# Patient Record
Sex: Male | Born: 1970 | Race: Black or African American | Hispanic: No | Marital: Married | Smoking: Current some day smoker
Health system: Southern US, Community
[De-identification: ages and names within clinical notes are randomized; demographics above are authoritative.]

## PROBLEM LIST (undated history)

## (undated) DIAGNOSIS — I10 Essential (primary) hypertension: Secondary | ICD-10-CM

## (undated) DIAGNOSIS — K219 Gastro-esophageal reflux disease without esophagitis: Secondary | ICD-10-CM

## (undated) DIAGNOSIS — M542 Cervicalgia: Secondary | ICD-10-CM

## (undated) DIAGNOSIS — G8929 Other chronic pain: Secondary | ICD-10-CM

## (undated) DIAGNOSIS — M549 Dorsalgia, unspecified: Secondary | ICD-10-CM

## (undated) DIAGNOSIS — N2881 Hypertrophy of kidney: Secondary | ICD-10-CM

## (undated) DIAGNOSIS — G4730 Sleep apnea, unspecified: Secondary | ICD-10-CM

## (undated) DIAGNOSIS — J449 Chronic obstructive pulmonary disease, unspecified: Secondary | ICD-10-CM

## (undated) DIAGNOSIS — F419 Anxiety disorder, unspecified: Secondary | ICD-10-CM

## (undated) DIAGNOSIS — G43909 Migraine, unspecified, not intractable, without status migrainosus: Secondary | ICD-10-CM

## (undated) DIAGNOSIS — F329 Major depressive disorder, single episode, unspecified: Secondary | ICD-10-CM

## (undated) DIAGNOSIS — E119 Type 2 diabetes mellitus without complications: Secondary | ICD-10-CM

## (undated) DIAGNOSIS — I517 Cardiomegaly: Secondary | ICD-10-CM

## (undated) DIAGNOSIS — F32A Depression, unspecified: Secondary | ICD-10-CM

## (undated) HISTORY — DX: Cardiomegaly: I51.7

## (undated) HISTORY — DX: Depression, unspecified: F32.A

## (undated) HISTORY — DX: Gastro-esophageal reflux disease without esophagitis: K21.9

## (undated) HISTORY — PX: CIRCUMCISION: SUR203

## (undated) HISTORY — DX: Anxiety disorder, unspecified: F41.9

## (undated) HISTORY — DX: Major depressive disorder, single episode, unspecified: F32.9

## (undated) HISTORY — DX: Migraine, unspecified, not intractable, without status migrainosus: G43.909

---

## 2002-09-26 ENCOUNTER — Emergency Department (HOSPITAL_COMMUNITY): Admission: EM | Admit: 2002-09-26 | Discharge: 2002-09-26 | Payer: Self-pay | Admitting: Emergency Medicine

## 2003-02-26 ENCOUNTER — Emergency Department (HOSPITAL_COMMUNITY): Admission: EM | Admit: 2003-02-26 | Discharge: 2003-02-26 | Payer: Self-pay | Admitting: Emergency Medicine

## 2003-07-24 ENCOUNTER — Emergency Department (HOSPITAL_COMMUNITY): Admission: EM | Admit: 2003-07-24 | Discharge: 2003-07-24 | Payer: Self-pay | Admitting: Emergency Medicine

## 2004-04-08 ENCOUNTER — Emergency Department: Payer: Self-pay | Admitting: Emergency Medicine

## 2007-05-24 ENCOUNTER — Ambulatory Visit: Payer: Self-pay | Admitting: Family Medicine

## 2007-09-13 ENCOUNTER — Emergency Department: Payer: Self-pay | Admitting: Emergency Medicine

## 2008-09-10 ENCOUNTER — Ambulatory Visit: Payer: Self-pay | Admitting: Neurosurgery

## 2009-04-03 ENCOUNTER — Emergency Department: Payer: Self-pay | Admitting: Emergency Medicine

## 2009-05-08 ENCOUNTER — Emergency Department: Payer: Self-pay | Admitting: Emergency Medicine

## 2009-12-08 ENCOUNTER — Observation Stay: Payer: Self-pay | Admitting: Internal Medicine

## 2010-08-01 ENCOUNTER — Emergency Department: Payer: Self-pay | Admitting: Internal Medicine

## 2010-10-09 ENCOUNTER — Ambulatory Visit: Payer: Self-pay | Admitting: Family Medicine

## 2010-11-30 ENCOUNTER — Ambulatory Visit: Payer: Self-pay

## 2011-04-09 ENCOUNTER — Emergency Department: Payer: Self-pay | Admitting: Unknown Physician Specialty

## 2011-04-09 LAB — CBC WITH DIFFERENTIAL/PLATELET
Basophil #: 0 10*3/uL (ref 0.0–0.1)
Eosinophil #: 0 10*3/uL (ref 0.0–0.7)
Eosinophil %: 0.2 %
HCT: 41.6 % (ref 40.0–52.0)
Lymphocyte #: 1.9 10*3/uL (ref 1.0–3.6)
MCH: 30.5 pg (ref 26.0–34.0)
MCHC: 32.8 g/dL (ref 32.0–36.0)
MCV: 93 fL (ref 80–100)
Monocyte #: 1 10*3/uL — ABNORMAL HIGH (ref 0.0–0.7)
Monocyte %: 8 %
Neutrophil %: 76.1 %
Platelet: 220 10*3/uL (ref 150–440)
RBC: 4.48 10*6/uL (ref 4.40–5.90)
RDW: 15 % — ABNORMAL HIGH (ref 11.5–14.5)
WBC: 12.5 10*3/uL — ABNORMAL HIGH (ref 3.8–10.6)

## 2011-04-09 LAB — COMPREHENSIVE METABOLIC PANEL
Albumin: 3.7 g/dL (ref 3.4–5.0)
Alkaline Phosphatase: 127 U/L (ref 50–136)
Anion Gap: 12 (ref 7–16)
BUN: 8 mg/dL (ref 7–18)
Bilirubin,Total: 0.7 mg/dL (ref 0.2–1.0)
Calcium, Total: 9.2 mg/dL (ref 8.5–10.1)
Chloride: 104 mmol/L (ref 98–107)
Creatinine: 0.92 mg/dL (ref 0.60–1.30)
EGFR (African American): >60
EGFR (Non-African Amer.): >60
Glucose: 97 mg/dL (ref 65–99)
Potassium: 4.2 mmol/L (ref 3.5–5.1)
SGOT(AST): 24 U/L (ref 15–37)
SGPT (ALT): 33 U/L
Sodium: 137 mmol/L (ref 136–145)
Total Protein: 8.4 g/dL — ABNORMAL HIGH (ref 6.4–8.2)

## 2011-04-09 LAB — MONONUCLEOSIS SCREEN: Mono Test: NEGATIVE

## 2011-04-15 LAB — CULTURE, BLOOD (SINGLE)

## 2011-06-07 ENCOUNTER — Emergency Department: Payer: Self-pay | Admitting: Emergency Medicine

## 2011-06-07 LAB — URINALYSIS, COMPLETE
Bacteria: NONE SEEN
Bilirubin,UR: NEGATIVE
Glucose,UR: NEGATIVE mg/dL (ref 0–75)
Ketone: NEGATIVE
Leukocyte Esterase: NEGATIVE
Protein: NEGATIVE
RBC,UR: 2 /HPF (ref 0–5)
WBC UR: NONE SEEN /HPF (ref 0–5)

## 2011-06-07 LAB — COMPREHENSIVE METABOLIC PANEL
Alkaline Phosphatase: 123 U/L (ref 50–136)
BUN: 16 mg/dL (ref 7–18)
Calcium, Total: 9.1 mg/dL (ref 8.5–10.1)
Co2: 27 mmol/L (ref 21–32)
EGFR (African American): >60
EGFR (Non-African Amer.): >60
Glucose: 102 mg/dL — ABNORMAL HIGH (ref 65–99)
Osmolality: 285 (ref 275–301)
Potassium: 3.5 mmol/L (ref 3.5–5.1)
SGOT(AST): 24 U/L (ref 15–37)
SGPT (ALT): 29 U/L
Sodium: 142 mmol/L (ref 136–145)

## 2011-06-07 LAB — CBC
HGB: 13.3 g/dL (ref 13.0–18.0)
MCHC: 32.6 g/dL (ref 32.0–36.0)
MCV: 94 fL (ref 80–100)
Platelet: 256 10*3/uL (ref 150–440)
RBC: 4.35 10*6/uL — ABNORMAL LOW (ref 4.40–5.90)
RDW: 15.7 % — ABNORMAL HIGH (ref 11.5–14.5)
WBC: 6.9 10*3/uL (ref 3.8–10.6)

## 2012-10-02 LAB — COMPREHENSIVE METABOLIC PANEL
Albumin: 3.7 g/dL (ref 3.4–5.0)
Alkaline Phosphatase: 156 U/L — ABNORMAL HIGH (ref 50–136)
Anion Gap: 7 (ref 7–16)
BUN: 11 mg/dL (ref 7–18)
Bilirubin,Total: 0.4 mg/dL (ref 0.2–1.0)
Calcium, Total: 9.1 mg/dL (ref 8.5–10.1)
Chloride: 107 mmol/L (ref 98–107)
Co2: 25 mmol/L (ref 21–32)
Creatinine: 1.12 mg/dL (ref 0.60–1.30)
EGFR (African American): >60
EGFR (Non-African Amer.): >60
Glucose: 109 mg/dL — ABNORMAL HIGH (ref 65–99)
Osmolality: 278 (ref 275–301)
SGOT(AST): 19 U/L (ref 15–37)
SGPT (ALT): 29 U/L (ref 12–78)
Sodium: 139 mmol/L (ref 136–145)
Total Protein: 7.6 g/dL (ref 6.4–8.2)

## 2012-10-02 LAB — URINALYSIS, COMPLETE
Bacteria: NONE SEEN
Bilirubin,UR: NEGATIVE
Glucose,UR: NEGATIVE mg/dL (ref 0–75)
Ketone: NEGATIVE
Leukocyte Esterase: NEGATIVE
Nitrite: NEGATIVE
Ph: 6 (ref 4.5–8.0)
Protein: NEGATIVE
RBC,UR: 9 /HPF (ref 0–5)
Specific Gravity: 1.021 (ref 1.003–1.030)
Squamous Epithelial: 1

## 2012-10-02 LAB — DRUG SCREEN, URINE
Amphetamines, Ur Screen: NEGATIVE (ref ?–1000)
Barbiturates, Ur Screen: NEGATIVE (ref ?–200)
Benzodiazepine, Ur Scrn: NEGATIVE (ref ?–200)
Cannabinoid 50 Ng, Ur ~~LOC~~: NEGATIVE (ref ?–50)
Cocaine Metabolite,Ur ~~LOC~~: NEGATIVE (ref ?–300)
Methadone, Ur Screen: NEGATIVE (ref ?–300)
Opiate, Ur Screen: NEGATIVE (ref ?–300)
Tricyclic, Ur Screen: NEGATIVE (ref ?–1000)

## 2012-10-02 LAB — CBC
HCT: 41.5 % (ref 40.0–52.0)
HGB: 14 g/dL (ref 13.0–18.0)
MCHC: 33.8 g/dL (ref 32.0–36.0)
MCV: 93 fL (ref 80–100)
Platelet: 238 10*3/uL (ref 150–440)
RBC: 4.47 10*6/uL (ref 4.40–5.90)
WBC: 9.1 10*3/uL (ref 3.8–10.6)

## 2012-10-02 LAB — ETHANOL: Ethanol %: <0.003 % (ref 0.000–0.080)

## 2012-10-03 ENCOUNTER — Inpatient Hospital Stay: Payer: Self-pay | Admitting: Psychiatry

## 2013-05-15 ENCOUNTER — Emergency Department: Payer: Self-pay | Admitting: Emergency Medicine

## 2013-05-15 LAB — ETHANOL
Ethanol %: 0.023 % (ref 0.000–0.080)
Ethanol: 23 mg/dL

## 2013-05-15 LAB — CBC
HCT: 40.1 % (ref 40.0–52.0)
HGB: 13.2 g/dL (ref 13.0–18.0)
MCH: 31.1 pg (ref 26.0–34.0)
MCHC: 33 g/dL (ref 32.0–36.0)
MCV: 94 fL (ref 80–100)
Platelet: 172 10*3/uL (ref 150–440)
RBC: 4.24 10*6/uL — ABNORMAL LOW (ref 4.40–5.90)
RDW: 14.1 % (ref 11.5–14.5)
WBC: 11.1 10*3/uL — ABNORMAL HIGH (ref 3.8–10.6)

## 2013-05-15 LAB — COMPREHENSIVE METABOLIC PANEL
Albumin: 3.7 g/dL (ref 3.4–5.0)
Alkaline Phosphatase: 133 U/L — ABNORMAL HIGH
Anion Gap: 7 (ref 7–16)
BUN: 13 mg/dL (ref 7–18)
Bilirubin,Total: 0.3 mg/dL (ref 0.2–1.0)
Calcium, Total: 8.6 mg/dL (ref 8.5–10.1)
Chloride: 105 mmol/L (ref 98–107)
Co2: 23 mmol/L (ref 21–32)
Creatinine: 1.1 mg/dL (ref 0.60–1.30)
EGFR (African American): >60
EGFR (Non-African Amer.): >60
Glucose: 133 mg/dL — ABNORMAL HIGH (ref 65–99)
Osmolality: 272 (ref 275–301)
Potassium: 3.7 mmol/L (ref 3.5–5.1)
SGOT(AST): 49 U/L — ABNORMAL HIGH (ref 15–37)
SGPT (ALT): 60 U/L (ref 12–78)
Sodium: 135 mmol/L — ABNORMAL LOW (ref 136–145)
Total Protein: 7.9 g/dL (ref 6.4–8.2)

## 2013-05-15 LAB — DRUG SCREEN, URINE

## 2013-05-23 ENCOUNTER — Emergency Department: Payer: Self-pay | Admitting: Emergency Medicine

## 2013-07-11 ENCOUNTER — Emergency Department: Payer: Self-pay | Admitting: Emergency Medicine

## 2013-07-11 LAB — CBC
HCT: 41.9 % (ref 40.0–52.0)
HGB: 13.6 g/dL (ref 13.0–18.0)
MCH: 30.6 pg (ref 26.0–34.0)
MCHC: 32.4 g/dL (ref 32.0–36.0)
MCV: 94 fL (ref 80–100)
Platelet: 268 10*3/uL (ref 150–440)
RBC: 4.44 10*6/uL (ref 4.40–5.90)
RDW: 14.8 % — ABNORMAL HIGH (ref 11.5–14.5)
WBC: 12.8 10*3/uL — ABNORMAL HIGH (ref 3.8–10.6)

## 2013-07-11 LAB — URINALYSIS, COMPLETE
Bacteria: NONE SEEN
Bilirubin,UR: NEGATIVE
Glucose,UR: NEGATIVE mg/dL (ref 0–75)
Ketone: NEGATIVE
Leukocyte Esterase: NEGATIVE
Nitrite: NEGATIVE
Ph: 5 (ref 4.5–8.0)
Protein: 30
RBC,UR: 11 /HPF (ref 0–5)
Specific Gravity: 1.027 (ref 1.003–1.030)
Squamous Epithelial: 1
WBC UR: 1 /HPF (ref 0–5)

## 2013-07-11 LAB — COMPREHENSIVE METABOLIC PANEL
Albumin: 3.6 g/dL (ref 3.4–5.0)
Alkaline Phosphatase: 187 U/L — ABNORMAL HIGH
Anion Gap: 7 (ref 7–16)
BUN: 10 mg/dL (ref 7–18)
Bilirubin,Total: 0.7 mg/dL (ref 0.2–1.0)
Calcium, Total: 9.8 mg/dL (ref 8.5–10.1)
Chloride: 103 mmol/L (ref 98–107)
Co2: 27 mmol/L (ref 21–32)
Creatinine: 0.9 mg/dL (ref 0.60–1.30)
EGFR (African American): >60
EGFR (Non-African Amer.): >60
Glucose: 102 mg/dL — ABNORMAL HIGH (ref 65–99)
Osmolality: 273 (ref 275–301)
Potassium: 3.9 mmol/L (ref 3.5–5.1)
SGOT(AST): 20 U/L (ref 15–37)
SGPT (ALT): 33 U/L (ref 12–78)
Sodium: 137 mmol/L (ref 136–145)
Total Protein: 8 g/dL (ref 6.4–8.2)

## 2013-07-11 LAB — LIPASE, BLOOD: Lipase: 277 U/L (ref 73–393)
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
## 2014-05-30 NOTE — Consult Note (Signed)
Brief Consult Note: Diagnosis: major depression.   Patient was seen by consultant.   Consult note dictated.   Recommend further assessment or treatment.   Orders entered.   Comments: Psychiatry: Patient seen. Under IVC for severe depression and SI. Needs inpt hospital tx. Orders done to admit to [facility].  Electronic Signatures: Gonzella Lex (MD)  (Signed 27-Aug-14 14:03)  Authored: Brief Consult Note   Last Updated: 27-Aug-14 14:03 by Gonzella Lex (MD)

## 2015-08-26 ENCOUNTER — Encounter: Payer: Self-pay | Admitting: Emergency Medicine

## 2015-08-26 ENCOUNTER — Emergency Department: Payer: Medicare Other

## 2015-08-26 ENCOUNTER — Emergency Department
Admission: EM | Admit: 2015-08-26 | Discharge: 2015-08-26 | Disposition: A | Discharge status: Home / Self Care | Payer: Medicare Other | Attending: Student | Admitting: Student

## 2015-08-26 DIAGNOSIS — E119 Type 2 diabetes mellitus without complications: Secondary | ICD-10-CM | POA: Insufficient documentation

## 2015-08-26 DIAGNOSIS — G43909 Migraine, unspecified, not intractable, without status migrainosus: Secondary | ICD-10-CM | POA: Diagnosis not present

## 2015-08-26 DIAGNOSIS — R4182 Altered mental status, unspecified: Secondary | ICD-10-CM | POA: Diagnosis present

## 2015-08-26 DIAGNOSIS — I10 Essential (primary) hypertension: Secondary | ICD-10-CM | POA: Diagnosis not present

## 2015-08-26 DIAGNOSIS — F419 Anxiety disorder, unspecified: Secondary | ICD-10-CM | POA: Diagnosis not present

## 2015-08-26 DIAGNOSIS — F17200 Nicotine dependence, unspecified, uncomplicated: Secondary | ICD-10-CM | POA: Insufficient documentation

## 2015-08-26 HISTORY — DX: Hypertrophy of kidney: N28.81

## 2015-08-26 HISTORY — DX: Cervicalgia: M54.2

## 2015-08-26 HISTORY — DX: Type 2 diabetes mellitus without complications: E11.9

## 2015-08-26 HISTORY — DX: Dorsalgia, unspecified: M54.9

## 2015-08-26 HISTORY — DX: Other chronic pain: G89.29

## 2015-08-26 HISTORY — DX: Essential (primary) hypertension: I10

## 2015-08-26 LAB — URINALYSIS COMPLETE WITH MICROSCOPIC (ARMC ONLY)
BILIRUBIN URINE: NEGATIVE
GLUCOSE, UA: NEGATIVE mg/dL
KETONES UR: NEGATIVE mg/dL
Leukocytes, UA: NEGATIVE
Nitrite: NEGATIVE
PROTEIN: 30 mg/dL — AB
SPECIFIC GRAVITY, URINE: 1.016 (ref 1.005–1.030)
pH: 6 (ref 5.0–8.0)

## 2015-08-26 LAB — CBC
HCT: 42.4 % (ref 40.0–52.0)
Hemoglobin: 14 g/dL (ref 13.0–18.0)
MCH: 30.4 pg (ref 26.0–34.0)
MCHC: 33 g/dL (ref 32.0–36.0)
MCV: 92 fL (ref 80.0–100.0)
PLATELETS: 254 10*3/uL (ref 150–440)
RBC: 4.6 MIL/uL (ref 4.40–5.90)
RDW: 14.8 % — AB (ref 11.5–14.5)
WBC: 8.8 10*3/uL (ref 3.8–10.6)

## 2015-08-26 LAB — BASIC METABOLIC PANEL
Anion gap: 9 (ref 5–15)
BUN: 18 mg/dL (ref 6–20)
CALCIUM: 9.4 mg/dL (ref 8.9–10.3)
CO2: 23 mmol/L (ref 22–32)
CREATININE: 1.07 mg/dL (ref 0.61–1.24)
Chloride: 106 mmol/L (ref 101–111)
GFR calc non Af Amer: >60 mL/min (ref >60)
Glucose, Bld: 167 mg/dL — ABNORMAL HIGH (ref 65–99)
Potassium: 3.6 mmol/L (ref 3.5–5.1)
SODIUM: 138 mmol/L (ref 135–145)

## 2015-08-26 LAB — ACETAMINOPHEN LEVEL: Acetaminophen (Tylenol), Serum: <10 ug/mL — ABNORMAL LOW (ref 10–30)

## 2015-08-26 LAB — TROPONIN I

## 2015-08-26 LAB — ETHANOL: Alcohol, Ethyl (B): <5 mg/dL (ref <5)

## 2015-08-26 LAB — SALICYLATE LEVEL: Salicylate Lvl: <4 mg/dL (ref 2.8–30.0)

## 2015-08-26 MED ORDER — KETOROLAC TROMETHAMINE 30 MG/ML IJ SOLN
15.0000 mg | Freq: Once | INTRAMUSCULAR | Status: AC
  Administered 2015-08-26: 15 mg via INTRAVENOUS
  Filled 2015-08-26: qty 1

## 2015-08-26 NOTE — ED Provider Notes (Signed)
[facility] Emergency Department Provider Note   ____________________________________________  Time seen: Approximately 6:48 PM  I have reviewed the triage vital signs and the nursing notes.   HISTORY  Chief Complaint Headache   HPI Martis Dommer is a 45 y.o. male with history of migraine headaches, chronic back pain, diabetes, hypertension who presents for evaluation of a migraine headache today as well as anxiety, gradual onset, initially moderate, now rates headache as 1 out of 10, improved with Aleve. Patient reports that today he developed a headache consistent with his usual migraine headache around 11 AM. Headache was initially moderate and then became more severe. He tried to treat with Aleve but reports that he did not feel his headache was getting better quickly and that made him "uneasy". He began having a lot of anxiety over this. On EMS arrival his blood pressure was elevated and he was brought to the emergency permit for evaluation. Currently he reports he feels much better. He denies any recent illness including no vomiting, diarrhea, fevers or chills. He denies any chest pain or difficulty breathing. He denies any fevers.   Past Medical History  Diagnosis Date  . Hypertension   . Diabetes mellitus without complication ([place])   . Chronic back pain   . Chronic neck pain   . Enlarged kidney Left    There are no active problems to display for this patient.   History reviewed. No pertinent past surgical history.  No current outpatient prescriptions on file.  Allergies Review of patient's allergies indicates no known allergies.  No family history on file.  Social History Social History  Substance Use Topics  . Smoking status: Current Every Day Smoker  . Smokeless tobacco: None  . Alcohol Use: Yes     Comment: rare    Review of Systems Constitutional: No fever/chills Eyes: No visual changes. ENT: No sore throat. Cardiovascular:  Denies chest pain. Respiratory: Denies shortness of breath. Gastrointestinal: No abdominal pain.  No nausea, no vomiting.  No diarrhea.  No constipation. Genitourinary: Negative for dysuria. Musculoskeletal: Negative for back pain. Skin: Negative for rash. Neurological: Positive for headaches, no focal weakness or numbness.  10-point ROS otherwise negative.  ____________________________________________   PHYSICAL EXAM:  VITAL SIGNS: ED Triage Vitals  Enc Vitals Group     BP 08/26/15 1638 140/100 mmHg     Pulse Rate 08/26/15 1638 76     Resp 08/26/15 1638 20     Temp 08/26/15 1638 98 F (36.7 C)     Temp Source 08/26/15 1638 Oral     SpO2 08/26/15 1638 98 %     Weight 08/26/15 1638 170 lb (77.111 kg)     Height 08/26/15 1638 5\' 8"  (1.727 m)     Head Cir --      Peak Flow --      Pain Score 08/26/15 1639 5     Pain Loc --      Pain Edu? --      Excl. in [place]? --     Constitutional: Alert and oriented. Well appearing and in no acute distress. Eyes: Conjunctivae are normal. PERRL. EOMI. Head: Atraumatic. Nose: No congestion/rhinnorhea. Mouth/Throat: Mucous membranes are moist.  Oropharynx non-erythematous. Neck: No stridor. Supple without meningismus.  Cardiovascular: Normal rate, regular rhythm. Grossly normal heart sounds.  Good peripheral circulation. Respiratory: Normal respiratory effort.  No retractions. Lungs CTAB. Gastrointestinal: Soft and nontender. No distention.  No CVA tenderness. Genitourinary: deferred Musculoskeletal: No lower extremity tenderness nor  edema.  No joint effusions. Neurologic:  Normal speech and language. No gross focal neurologic deficits are appreciated. No gait instability. 5 out of 5 strength in bilateral upper and lower extremities, sensation intact to light touch throughout, cranial nerves II through XII intact. Skin:  Skin is warm, dry and intact. No rash noted. Psychiatric: Mood and affect are normal. Speech and behavior are  normal.  ____________________________________________   LABS (all labs ordered are listed, but only abnormal results are displayed)  Labs Reviewed  BASIC METABOLIC PANEL - Abnormal; Notable for the following:    Glucose, Bld 167 (*)    All other components within normal limits  CBC - Abnormal; Notable for the following:    RDW 14.8 (*)    All other components within normal limits  URINALYSIS COMPLETEWITH MICROSCOPIC ([facility] ONLY) - Abnormal; Notable for the following:    Color, Urine YELLOW (*)    APPearance CLEAR (*)    Hgb urine dipstick 1+ (*)    Protein, ur 30 (*)    Bacteria, UA RARE (*)    Squamous Epithelial / LPF 0-5 (*)    All other components within normal limits  ACETAMINOPHEN LEVEL - Abnormal; Notable for the following:    Acetaminophen (Tylenol), Serum <10 (*)    All other components within normal limits  ETHANOL  SALICYLATE LEVEL  TROPONIN I   ____________________________________________  EKG  ED ECG REPORT I, Joanne Gavel, the attending physician, personally viewed and interpreted this ECG.   Date: 08/26/2015  EKG Time: 16:46  Rate: 75  Rhythm: normal sinus rhythm  Axis: normal  Intervals:none  ST&T Change: No acute ST elevation or acute ST depression. Q waves with T wave inversions in the inferior leads, this was seen in lead II on the prior EKG from 2013. It is also noted in V6.  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 Study otherwise unremarkable. In particular, no intracranial mass, hemorrhage, or focal gray - white compartment lesion.  ____________________________________________   PROCEDURES  Procedure(s) performed: None  Procedures  Critical Care performed: No  ____________________________________________   INITIAL IMPRESSION / ASSESSMENT AND PLAN / ED COURSE  Pertinent labs & imaging results that were available during my care of the  patient were reviewed by me and considered in my medical decision making (see chart for details).  Emanuell Claussen is a 45 y.o. male with history of migraine headaches, chronic back pain, diabetes, hypertension who presents for evaluation of a migraine headache today as well as anxiety, gradual onset, initially moderate, now rates headache as 1 out of 10, improved with Aleve. On exam, he is very well-appearing and in no acute distress, vital signs are stable, he is afebrile. Neck supple without meningismus, intact neurological exam, not consistent with subarachnoid hemorrhage or meningitis. I reviewed his labs. CBC and BMP unremarkable. Undetectable ethanol significant and salicylate levels. Urinalysis is not consistent with infection. 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 Suspect migraine headache which is now significantly improved as well as anxiety/panic attack. He is not altered in any way though this was noted in the triage note. Awaiting troponin. He received Toradol and reports continued improvement of his headache. If his troponin is unremarkable, anticipate he can be discharged with close outpatient follow-up.  ----------------------------------------- 8:21 PM on 08/26/2015 -----------------------------------------  Troponin negative. Patient feels well. We discussed return precautions, need for close PCP follow-up and he  is, trouble with the discharge plan. DC home. ____________________________________________   FINAL CLINICAL IMPRESSION(S) / ED DIAGNOSES  Final diagnoses:  Migraine without status migrainosus, not intractable, unspecified migraine type  Anxiety      NEW MEDICATIONS STARTED DURING THIS VISIT:  New Prescriptions   No medications on file     Note:  This document was prepared using Dragon voice recognition software and may include unintentional dictation errors.    Joanne Gavel, MD 08/26/15 2021

## 2015-08-26 NOTE — Discharge Instructions (Signed)
Migraine Headache A migraine headache is very bad, throbbing pain on one or both sides of your head. Talk to your doctor about what things may bring on (trigger) your migraine headaches. HOME CARE  Only take medicines as told by your doctor.  Lie down in a dark, quiet room when you have a migraine.  Keep a journal to find out if certain things bring on migraine headaches. For example, write down:  What you eat and drink.  How much sleep you get.  Any change to your diet or medicines.  Lessen how much alcohol you drink.  Quit smoking if you smoke.  Get enough sleep.  Lessen any stress in your life.  Keep lights dim if bright lights bother you or make your migraines worse. GET HELP RIGHT AWAY IF:   Your migraine becomes really bad.  You have a fever.  You have a stiff neck.  You have trouble seeing.  Your muscles are weak, or you lose muscle control.  You lose your balance or have trouble walking.  You feel like you will pass out (faint), or you pass out.  You have really bad symptoms that are different than your first symptoms. MAKE SURE YOU:   Understand these instructions.  Will watch your condition.  Will get help right away if you are not doing well or get worse.   This information is not intended to replace advice given to you by your health care provider. Make sure you discuss any questions you have with your health care provider.   Document Released: 11/03/2007 Document Revised: 04/18/2011 Document Reviewed: 10/01/2012 Elsevier Interactive Patient Education 2016 Elsevier Inc.  Panic Attacks Panic attacks are sudden, short-livedsurges of severe anxiety, fear, or discomfort. They may occur for no reason when you are relaxed, when you are anxious, or when you are sleeping. Panic attacks may occur for a number of reasons:   Healthy people occasionally have panic attacks in extreme, life-threatening situations, such as war or natural disasters. Normal  anxiety is a protective mechanism of the body that helps [place] react to danger (fight or flight response).  Panic attacks are often seen with anxiety disorders, such as panic disorder, social anxiety disorder, generalized anxiety disorder, and phobias. Anxiety disorders cause excessive or uncontrollable anxiety. They may interfere with your relationships or other life activities.  Panic attacks are sometimes seen with other mental illnesses, such as depression and posttraumatic stress disorder.  Certain medical conditions, prescription medicines, and drugs of abuse can cause panic attacks. SYMPTOMS  Panic attacks start suddenly, peak within 20 minutes, and are accompanied by four or more of the following symptoms:  Pounding heart or fast heart rate (palpitations).  Sweating.  Trembling or shaking.  Shortness of breath or feeling smothered.  Feeling choked.  Chest pain or discomfort.  Nausea or strange feeling in your stomach.  Dizziness, light-headedness, or feeling like you will faint.  Chills or hot flushes.  Numbness or tingling in your lips or hands and feet.  Feeling that things are not real or feeling that you are not yourself.  Fear of losing control or going crazy.  Fear of dying. Some of these symptoms can mimic serious medical conditions. For example, you may think you are having a heart attack. Although panic attacks can be very scary, they are not life threatening. DIAGNOSIS  Panic attacks are diagnosed through an assessment by your health care provider. Your health care provider will ask questions about your symptoms, such as where and  when they occurred. Your health care provider will also ask about your medical history and use of alcohol and drugs, including prescription medicines. Your health care provider may order blood tests or other studies to rule out a serious medical condition. Your health care provider may refer you to a mental health professional for further  evaluation. TREATMENT   Most healthy people who have one or two panic attacks in an extreme, life-threatening situation will not require treatment.  The treatment for panic attacks associated with anxiety disorders or other mental illness typically involves counseling with a mental health professional, medicine, or a combination of both. Your health care provider will help determine what treatment is best for you.  Panic attacks due to physical illness usually go away with treatment of the illness. If prescription medicine is causing panic attacks, talk with your health care provider about stopping the medicine, decreasing the dose, or substituting another medicine.  Panic attacks due to alcohol or drug abuse go away with abstinence. Some adults need professional help in order to stop drinking or using drugs. HOME CARE INSTRUCTIONS   Take all medicines as directed by your health care provider.   Schedule and attend follow-up visits as directed by your health care provider. It is important to keep all your appointments. SEEK MEDICAL CARE IF:  You are not able to take your medicines as prescribed.  Your symptoms do not improve or get worse. SEEK IMMEDIATE MEDICAL CARE IF:   You experience panic attack symptoms that are different than your usual symptoms.  You have serious thoughts about hurting yourself or others.  You are taking medicine for panic attacks and have a serious side effect. MAKE SURE YOU:  Understand these instructions.  Will watch your condition.  Will get help right away if you are not doing well or get worse.   This information is not intended to replace advice given to you by your health care provider. Make sure you discuss any questions you have with your health care provider.   Document Released: 01/24/2005 Document Revised: 01/29/2013 Document Reviewed: 09/07/2012 Elsevier Interactive Patient Education [employer].

## 2015-08-26 NOTE — ED Notes (Signed)
[facility], [facility], and weakness. Patient took mother to appointment at approx noon, was not feeling well since approx 1030 (general malaise, HA). States he went home afterwards. Patient arrives to ER with AMS, weakness, and still small headache. Wife states patient had h/o two other episodes similar to this in last 5 years with diagnosis being HTN and anxiety.

## 2015-12-01 DIAGNOSIS — G479 Sleep disorder, unspecified: Secondary | ICD-10-CM | POA: Insufficient documentation

## 2015-12-01 DIAGNOSIS — G43009 Migraine without aura, not intractable, without status migrainosus: Secondary | ICD-10-CM | POA: Insufficient documentation

## 2015-12-01 DIAGNOSIS — F411 Generalized anxiety disorder: Secondary | ICD-10-CM | POA: Insufficient documentation

## 2016-01-12 DIAGNOSIS — M545 Low back pain, unspecified: Secondary | ICD-10-CM | POA: Insufficient documentation

## 2016-01-12 DIAGNOSIS — R200 Anesthesia of skin: Secondary | ICD-10-CM | POA: Insufficient documentation

## 2016-01-12 DIAGNOSIS — G8929 Other chronic pain: Secondary | ICD-10-CM | POA: Insufficient documentation

## 2016-01-12 DIAGNOSIS — R202 Paresthesia of skin: Secondary | ICD-10-CM

## 2016-03-08 ENCOUNTER — Ambulatory Visit: Payer: Medicare Other | Attending: Physical Medicine and Rehabilitation

## 2016-03-08 VITALS — BP 145/99 | HR 85

## 2016-03-08 DIAGNOSIS — R262 Difficulty in walking, not elsewhere classified: Secondary | ICD-10-CM | POA: Diagnosis present

## 2016-03-08 DIAGNOSIS — M5416 Radiculopathy, lumbar region: Secondary | ICD-10-CM

## 2016-03-08 DIAGNOSIS — M5412 Radiculopathy, cervical region: Secondary | ICD-10-CM | POA: Insufficient documentation

## 2016-03-08 DIAGNOSIS — M545 Low back pain: Secondary | ICD-10-CM | POA: Insufficient documentation

## 2016-03-08 DIAGNOSIS — G8929 Other chronic pain: Secondary | ICD-10-CM

## 2016-03-08 DIAGNOSIS — M542 Cervicalgia: Secondary | ICD-10-CM

## 2016-03-08 DIAGNOSIS — M6281 Muscle weakness (generalized): Secondary | ICD-10-CM | POA: Insufficient documentation
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
## 2016-03-08 NOTE — Patient Instructions (Signed)
  Pt was recommended to use a lumbar towel roll to be placed at the most comfortable area of his back (and comfortable thickness) for 2 minutes at a time daily for back pain when sitting. Pt verbalized understanding.

## 2016-03-10 ENCOUNTER — Ambulatory Visit: Payer: Medicare Other | Attending: Physical Medicine and Rehabilitation

## 2016-03-10 DIAGNOSIS — M542 Cervicalgia: Secondary | ICD-10-CM | POA: Insufficient documentation

## 2016-03-10 DIAGNOSIS — G8929 Other chronic pain: Secondary | ICD-10-CM | POA: Insufficient documentation

## 2016-03-10 DIAGNOSIS — M6281 Muscle weakness (generalized): Secondary | ICD-10-CM | POA: Insufficient documentation

## 2016-03-10 DIAGNOSIS — M545 Low back pain: Secondary | ICD-10-CM | POA: Insufficient documentation

## 2016-03-10 DIAGNOSIS — R262 Difficulty in walking, not elsewhere classified: Secondary | ICD-10-CM | POA: Insufficient documentation

## 2016-03-10 DIAGNOSIS — M5412 Radiculopathy, cervical region: Secondary | ICD-10-CM | POA: Insufficient documentation

## 2016-03-10 DIAGNOSIS — M5416 Radiculopathy, lumbar region: Secondary | ICD-10-CM | POA: Insufficient documentation

## 2016-03-15 ENCOUNTER — Ambulatory Visit: Payer: Medicare Other

## 2016-03-15 DIAGNOSIS — M542 Cervicalgia: Secondary | ICD-10-CM

## 2016-03-15 DIAGNOSIS — M6281 Muscle weakness (generalized): Secondary | ICD-10-CM | POA: Diagnosis present

## 2016-03-15 DIAGNOSIS — M5416 Radiculopathy, lumbar region: Secondary | ICD-10-CM | POA: Diagnosis present

## 2016-03-15 DIAGNOSIS — M5412 Radiculopathy, cervical region: Secondary | ICD-10-CM

## 2016-03-15 DIAGNOSIS — G8929 Other chronic pain: Secondary | ICD-10-CM

## 2016-03-15 DIAGNOSIS — R262 Difficulty in walking, not elsewhere classified: Secondary | ICD-10-CM

## 2016-03-15 DIAGNOSIS — M545 Low back pain: Secondary | ICD-10-CM | POA: Diagnosis not present
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
## 2016-03-15 NOTE — Patient Instructions (Addendum)
  Pt was also recommended pt to perform gentle pain free chin tucks throughout the day as well as to maintain the upright back posture comfortably throughout the day. Pt demonstrated and verbalized understanding.     Reviewed over the counter TENs unit option for pain control at home. Pt demonstrated and verbalized understanding.

## 2016-03-17 ENCOUNTER — Ambulatory Visit: Payer: Medicare Other

## 2016-03-17 DIAGNOSIS — M6281 Muscle weakness (generalized): Secondary | ICD-10-CM

## 2016-03-17 DIAGNOSIS — M545 Low back pain: Secondary | ICD-10-CM | POA: Diagnosis not present

## 2016-03-17 DIAGNOSIS — R262 Difficulty in walking, not elsewhere classified: Secondary | ICD-10-CM

## 2016-03-17 DIAGNOSIS — M5416 Radiculopathy, lumbar region: Secondary | ICD-10-CM

## 2016-03-17 DIAGNOSIS — G8929 Other chronic pain: Secondary | ICD-10-CM

## 2016-03-17 NOTE — Patient Instructions (Addendum)
     Sitting on an upright chair with a towel roll at your low back:   Gently press your right heel onto the floor for 2 seconds.    Gently relax.    Repeat 5 times for 3 sets daily.    Perform for your left heel as well.
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
## 2016-03-21 ENCOUNTER — Ambulatory Visit: Payer: Medicare Other

## 2016-03-21 DIAGNOSIS — G5603 Carpal tunnel syndrome, bilateral upper limbs: Secondary | ICD-10-CM | POA: Insufficient documentation

## 2016-03-22 ENCOUNTER — Ambulatory Visit: Payer: Medicare Other

## 2016-03-22 DIAGNOSIS — G8929 Other chronic pain: Secondary | ICD-10-CM

## 2016-03-22 DIAGNOSIS — M6281 Muscle weakness (generalized): Secondary | ICD-10-CM

## 2016-03-22 DIAGNOSIS — M545 Low back pain: Principal | ICD-10-CM

## 2016-03-22 DIAGNOSIS — R262 Difficulty in walking, not elsewhere classified: Secondary | ICD-10-CM
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
## 2016-03-24 ENCOUNTER — Ambulatory Visit: Payer: Medicare Other

## 2016-03-29 ENCOUNTER — Telehealth: Payer: Self-pay

## 2016-03-29 ENCOUNTER — Ambulatory Visit: Payer: Medicare Other

## 2016-03-29 DIAGNOSIS — K921 Melena: Secondary | ICD-10-CM | POA: Insufficient documentation

## 2016-03-29 NOTE — Telephone Encounter (Signed)
   No show. Unable to contact pt secondary to mobile phone having a busy signal, and unable to leave a message for his home phone line.

## 2016-03-31 ENCOUNTER — Ambulatory Visit: Payer: Medicare Other

## 2016-03-31 DIAGNOSIS — M545 Low back pain: Secondary | ICD-10-CM | POA: Diagnosis not present

## 2016-03-31 DIAGNOSIS — G8929 Other chronic pain: Secondary | ICD-10-CM

## 2016-03-31 DIAGNOSIS — M6281 Muscle weakness (generalized): Secondary | ICD-10-CM

## 2016-03-31 DIAGNOSIS — R262 Difficulty in walking, not elsewhere classified: Secondary | ICD-10-CM

## 2016-03-31 DIAGNOSIS — M5416 Radiculopathy, lumbar region: Secondary | ICD-10-CM
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
## 2016-04-05 ENCOUNTER — Ambulatory Visit: Payer: Medicare Other | Admitting: Physical Therapy

## 2016-04-05 VITALS — BP 140/90

## 2016-04-05 DIAGNOSIS — G8929 Other chronic pain: Secondary | ICD-10-CM

## 2016-04-05 DIAGNOSIS — M6281 Muscle weakness (generalized): Secondary | ICD-10-CM

## 2016-04-05 DIAGNOSIS — M542 Cervicalgia: Secondary | ICD-10-CM

## 2016-04-05 DIAGNOSIS — R262 Difficulty in walking, not elsewhere classified: Secondary | ICD-10-CM

## 2016-04-05 DIAGNOSIS — M545 Low back pain: Principal | ICD-10-CM

## 2016-04-05 DIAGNOSIS — M5416 Radiculopathy, lumbar region: Secondary | ICD-10-CM

## 2016-04-05 DIAGNOSIS — M5412 Radiculopathy, cervical region: Secondary | ICD-10-CM

## 2016-04-06 ENCOUNTER — Other Ambulatory Visit: Payer: Self-pay | Admitting: Neurology

## 2016-04-06 ENCOUNTER — Telehealth: Payer: Self-pay | Admitting: Physical Therapy

## 2016-04-06 DIAGNOSIS — G43019 Migraine without aura, intractable, without status migrainosus: Secondary | ICD-10-CM

## 2016-04-06 NOTE — Telephone Encounter (Signed)
Aquatic PT phoned the office of Dr. Theotis Burrow, MD [phone] (pt's PCP). Message was recorded with staff re: pt's elevated BP 160/100 mm Hg across two PT sessions. Yesterday's visit, pre-Tx 140/90 mm Hg and post Tx 160/100 mm Hg and did not go done after resting for 30 min. PT wanted MD to be made aware of his BP elevation

## 2016-04-06 NOTE — Telephone Encounter (Signed)
Aquatic PT phoned the office of Dr. Saralyn Pilar office. Message was taken by staff re: pt's elevated BP 160/100 across two visits (1 land visit prior to pool Tx and after pool Tx). Pt's future sessions will be held on land for safety. PT inquired re: a psychiatrist referral for pt as he would benefit greatly with more support for his anxiety and depression.

## 2016-04-06 NOTE — Patient Instructions (Signed)
Body scan technique (emailed)  Paced breathing 1-2- pause, 2-1 pause  Graded movement (moving before the point of pain)

## 2016-04-06 NOTE — Therapy (Addendum)
[place] [facility] [address] [place], [place], [address] Phone: [phone]   Fax:  [phone]  Physical Therapy Treatment  Patient Details  Name: [name] MRN: [id number] Date of Birth: Jan 02, 1971 Referring Provider: Sharlet Salina, DO  Encounter Date: 04/05/2016      PT End of Session - 04/06/16 0953    Visit Number 6   Number of Visits 17   Date for PT Re-Evaluation 05/05/16   Authorization Type 6   Authorization Time Period of 10 g code   PT Start Time 0845   PT Stop Time 1015   PT Time Calculation (min) 90 min   Activity Tolerance Patient limited by pain   Behavior During Therapy [facility] for tasks assessed/performed      Past Medical History:  Diagnosis Date  . Anxiety   . Chronic back pain   . Chronic neck pain   . Depression   . Diabetes mellitus without complication ([place])   . Enlarged heart    pt report  . Enlarged kidney Left  . GERD (gastroesophageal reflux disease)   . Headache, migraine   . Hypertension     No past surgical history on file.  Vitals:   04/05/16 0906  BP: 140/90        Subjective Assessment - 04/06/16 2145    Subjective Pt states that Dr. Melrose Nakayama increased the dosage of one of his medications, pt does not remember which medication.  Back is an 8/10. Still hurts. Has been having migrains, that's why he has not been coming.  Pt states that he knows how to get to the aqua therapy.                      Adult Aquatic Therapy - 04/06/16 0947      Aquatic Therapy Subjective   Subjective Pt reported feeling pain with standing for long periods. Pt has been sleeping 2-3 hours due to pain . Pt has had migraines, is sensitive to noise, and has had pani attacks. Pt did not eat breakfast this morning and usually skips it           O: Pt entered the pool via steps with single UE support on rail (pain reported) and exited pool via ramp.   50 ft =1 lap  Exercises  performed in 3'6" depth   Stretches with education on graded exposure  Rotation, flexion/extension, side flexion of neck  Tolerated ~5 deg rotation, ~20 deg side flexion, mini squat with education to use this technique for bending instead of forward flexion    Radiating pain down B arms to deltoid level, decreased with median nerve glide 5 reps  Breathing education and practice Pace breathing inhale 1-2- pause, exhale 2-1 pause  Transition to supine to floating on noodles under hips, pelvis, back, armpit, neck , support and drawn by PT  Education on body scan, pain science  2 laps  Graded exposure to BUE/LE abduction, adduction ~10 deg  Report of shoulder stiffness/pain after 2 laps    Guided and assisted pt on transitioning to stand with minimal strain on neck and back.                PT Education - 04/06/16 [phone]    Education provided Yes   Education Details paced breathing, graded movement, eating breakfast for PT and monitoring BP and blood sugars, f/u with MD re: elevated BP, motivational interviewing with encouragement towards improvement of health  and pain   Person(s) Educated Patient   Methods Explanation;Demonstration;Tactile cues;Verbal cues   Comprehension Returned demonstration;Verbalized understanding             PT Long Term Goals - 03/08/16 1310      PT LONG TERM GOAL #1   Title Patient will improve his Modified Oswestry Low Back Pain Disability Questionnaire score by at least 12% as a demonstration of improved function.    Baseline 70% (03/08/2016)   Time 8   Period Weeks   Status New     PT LONG TERM GOAL #2   Title Patient will improve his Quick Dash Disability/Symptom Score by at least 15% as a demonstration of improved function.    Baseline 79.55% (03/08/2016)   Time 8   Period Weeks   Status New     PT LONG TERM GOAL #3   Title Patient will have a decrease in back pain to 5/10 or less at worst to promote ability to perform functional  tasks.    Baseline 8/10 back pain at worst (03/08/2016)   Time 8   Period Weeks   Status New     PT LONG TERM GOAL #4   Title Patient will have a decrease in neck pain to 5/10 or less at worst to promote ability to perform functional tasks, turn his head.    Baseline 8/10 neck pain at worst (03/08/2016)   Time 8   Period Weeks   Status New               Plan - 04/06/16 [id number]    Clinical Impression Statement Pt reported that the pool session was relaxing.Pt demo'd less guarded posture end of session.  Pt tolerated small ranges of movement and demo'd understanding to stop before pain ad learned paced breathing and customized movements to participate further in the session. Pt showed elevated BP following 30 min pool Tx. Pre-Tx: 140/90 mm/Hg , Post Tx: 160/100 mm Hg,  after 30 min rest break with guided slow breathing: 160/100 mm Hg.  Pt tolerated the pool session which was primarily focused on pain science education, graded movement in supine (~20 deg BUE/LE abduction/adduction with coordinated slowed breathing), floating on noodles with support of PT. Pt reported feeling relaxed but started to feel stiffnesss in his L shoulder after 2 laps (50 ft x 2). Pt was assisted to standing position gradually and pt reported LBP pain and heaviness in his leg with standing. Pt was guided to exit the pool at which he reported dizziness and was provided SBA via steps using single UE on rail.  Pt reported dizziness upon exiting the pool via steps with single UE on rail with PT providing standby assist. Pt was placed in a chair and and BP was monitored. Pt showed no sweating, SOB, nor  facial droop, slurred speech. Pt remained coherent and was provided Gatorade and crackers.  PT has notified his PCP and referring provider. Pt was educated that future sessions will be conducted on land. PT called patient 2 hours later and he made it home safe and feeling better. PT left messages with PCP and referring provider re:  high BP.  Pt remains motivated to improve pain to be able to do more and to be a father for his children but expressed he is faced with depression and anxiety.  Pt has not seen his psychiatrist recently.  PT informed referring provider that pt would benefit from a referral for psychotherapist/psychiatrist and will have a beter  prognosis with an interdisciplinary healthcare team. Pt will continue to benefit from skilled PT and the use of a biopsychosocial approach to pain management, customized manual Tx/ therapeutic ex to decrease radiating pain, and body mechanics training.    Rehab Potential Fair   Clinical Impairments Affecting Rehab Potential chronicity of condition, pain   PT Frequency 2x / week   PT Duration 8 weeks   PT Treatment/Interventions Aquatic Therapy;Manual techniques;Dry needling;Patient/family education;Neuromuscular re-education;Therapeutic exercise;Therapeutic activities;Cryotherapy;Ultrasound;Traction;Moist Heat;Iontophoresis 4mg /ml Dexamethasone;Electrical Stimulation;Gait training;Functional mobility training;Balance training   PT Next Visit Plan Modalities PRN for pain control, gentle strengthening, function, aquatic therapy (when available)   Consulted and Agree with Plan of Care Patient      Patient will benefit from skilled therapeutic intervention in order to improve the following deficits and impairments:  Pain, Postural dysfunction, Improper body mechanics, Difficulty walking, Decreased strength, Decreased range of motion, Abnormal gait  Visit Diagnosis: Chronic bilateral low back pain, with sciatica presence unspecified  Difficulty in walking, not elsewhere classified  Muscle weakness (generalized)  Radiculopathy, lumbar region  Cervicalgia  Radiculopathy, cervical region     Problem List There are no active problems to display for this patient.   Jerl Mina ,PT, DPT, E-RYT  04/06/2016, 9:45 PM  [place]  MAIN [facility] SERVICES [address] [place], [place], [address] Phone: [phone]   Fax:  [phone]  Name: [name] MRN: [id number] Date of Birth: November 14, 1970

## 2016-04-08 ENCOUNTER — Emergency Department: Payer: Medicare Other

## 2016-04-08 ENCOUNTER — Encounter: Payer: Self-pay | Admitting: Emergency Medicine

## 2016-04-08 ENCOUNTER — Observation Stay
Admission: EM | Admit: 2016-04-08 | Discharge: 2016-04-09 | Disposition: A | Discharge status: Home / Self Care | Payer: Medicare Other | Attending: Internal Medicine | Admitting: Internal Medicine

## 2016-04-08 DIAGNOSIS — F17200 Nicotine dependence, unspecified, uncomplicated: Secondary | ICD-10-CM | POA: Diagnosis not present

## 2016-04-08 DIAGNOSIS — K219 Gastro-esophageal reflux disease without esophagitis: Secondary | ICD-10-CM | POA: Insufficient documentation

## 2016-04-08 DIAGNOSIS — E1140 Type 2 diabetes mellitus with diabetic neuropathy, unspecified: Secondary | ICD-10-CM | POA: Diagnosis not present

## 2016-04-08 DIAGNOSIS — R079 Chest pain, unspecified: Secondary | ICD-10-CM | POA: Insufficient documentation

## 2016-04-08 DIAGNOSIS — G43909 Migraine, unspecified, not intractable, without status migrainosus: Secondary | ICD-10-CM | POA: Diagnosis not present

## 2016-04-08 DIAGNOSIS — Z9114 Patient's other noncompliance with medication regimen: Secondary | ICD-10-CM | POA: Insufficient documentation

## 2016-04-08 DIAGNOSIS — I10 Essential (primary) hypertension: Secondary | ICD-10-CM | POA: Diagnosis not present

## 2016-04-08 DIAGNOSIS — F419 Anxiety disorder, unspecified: Secondary | ICD-10-CM | POA: Diagnosis not present

## 2016-04-08 DIAGNOSIS — I160 Hypertensive urgency: Principal | ICD-10-CM | POA: Insufficient documentation

## 2016-04-08 DIAGNOSIS — F329 Major depressive disorder, single episode, unspecified: Secondary | ICD-10-CM | POA: Insufficient documentation

## 2016-04-08 DIAGNOSIS — Z7984 Long term (current) use of oral hypoglycemic drugs: Secondary | ICD-10-CM | POA: Diagnosis not present

## 2016-04-08 DIAGNOSIS — G8929 Other chronic pain: Secondary | ICD-10-CM | POA: Diagnosis not present

## 2016-04-08 DIAGNOSIS — Z79899 Other long term (current) drug therapy: Secondary | ICD-10-CM | POA: Diagnosis not present

## 2016-04-08 LAB — TROPONIN I
Troponin I: <0.03 ng/mL (ref <0.03)
Troponin I: <0.03 ng/mL (ref <0.03)

## 2016-04-08 LAB — BASIC METABOLIC PANEL
ANION GAP: 9 (ref 5–15)
BUN: 14 mg/dL (ref 6–20)
CO2: 27 mmol/L (ref 22–32)
Calcium: 9.9 mg/dL (ref 8.9–10.3)
Chloride: 105 mmol/L (ref 101–111)
Creatinine, Ser: 1.06 mg/dL (ref 0.61–1.24)
GFR calc Af Amer: >60 mL/min (ref >60)
Glucose, Bld: 110 mg/dL — ABNORMAL HIGH (ref 65–99)
POTASSIUM: 3.8 mmol/L (ref 3.5–5.1)
SODIUM: 141 mmol/L (ref 135–145)

## 2016-04-08 LAB — GLUCOSE, CAPILLARY
Glucose-Capillary: 113 mg/dL — ABNORMAL HIGH (ref 65–99)
Glucose-Capillary: 117 mg/dL — ABNORMAL HIGH (ref 65–99)

## 2016-04-08 LAB — CBC
HEMATOCRIT: 44.1 % (ref 40.0–52.0)
HEMOGLOBIN: 14.2 g/dL (ref 13.0–18.0)
MCH: 28.9 pg (ref 26.0–34.0)
MCHC: 32.2 g/dL (ref 32.0–36.0)
MCV: 89.7 fL (ref 80.0–100.0)
Platelets: 338 10*3/uL (ref 150–440)
RBC: 4.92 MIL/uL (ref 4.40–5.90)
RDW: 14.9 % — ABNORMAL HIGH (ref 11.5–14.5)
WBC: 8.4 10*3/uL (ref 3.8–10.6)

## 2016-04-08 MED ORDER — METOPROLOL TARTRATE 25 MG PO TABS
25.0000 mg | ORAL_TABLET | Freq: Two times a day (BID) | ORAL | Status: DC
  Administered 2016-04-08 – 2016-04-09 (×3): 25 mg via ORAL
  Filled 2016-04-08 (×3): qty 1

## 2016-04-08 MED ORDER — SUMATRIPTAN SUCCINATE 50 MG PO TABS
100.0000 mg | ORAL_TABLET | ORAL | Status: DC | PRN
  Administered 2016-04-09: 100 mg via ORAL
  Filled 2016-04-08 (×3): qty 2

## 2016-04-08 MED ORDER — METFORMIN HCL 500 MG PO TABS
500.0000 mg | ORAL_TABLET | Freq: Two times a day (BID) | ORAL | Status: DC
  Administered 2016-04-08 – 2016-04-09 (×2): 500 mg via ORAL
  Filled 2016-04-08 (×2): qty 1

## 2016-04-08 MED ORDER — HYDRALAZINE HCL 20 MG/ML IJ SOLN
5.0000 mg | Freq: Once | INTRAMUSCULAR | Status: AC
  Administered 2016-04-08: 5 mg via INTRAVENOUS
  Filled 2016-04-08: qty 1

## 2016-04-08 MED ORDER — LISINOPRIL 10 MG PO TABS
10.0000 mg | ORAL_TABLET | Freq: Once | ORAL | Status: AC
  Administered 2016-04-08: 10 mg via ORAL
  Filled 2016-04-08: qty 1

## 2016-04-08 MED ORDER — ASPIRIN 81 MG PO CHEW
324.0000 mg | CHEWABLE_TABLET | Freq: Once | ORAL | Status: AC
  Administered 2016-04-08: 324 mg via ORAL
  Filled 2016-04-08: qty 4

## 2016-04-08 MED ORDER — BUTALBITAL-APAP-CAFFEINE 50-325-40 MG PO TABS
ORAL_TABLET | Freq: Every day | ORAL | Status: DC
  Administered 2016-04-08 – 2016-04-09 (×2): 1 via ORAL
  Filled 2016-04-08 (×2): qty 1

## 2016-04-08 MED ORDER — MORPHINE SULFATE (PF) 2 MG/ML IV SOLN
2.0000 mg | Freq: Once | INTRAVENOUS | Status: AC
  Administered 2016-04-08: 2 mg via INTRAVENOUS
  Filled 2016-04-08: qty 1

## 2016-04-08 MED ORDER — METOPROLOL TARTRATE 5 MG/5ML IV SOLN: 5.0000 mg | INTRAVENOUS | Status: DC | PRN

## 2016-04-08 MED ORDER — SERTRALINE HCL 50 MG PO TABS
50.0000 mg | ORAL_TABLET | Freq: Every day | ORAL | Status: DC
  Administered 2016-04-09: 50 mg via ORAL
  Filled 2016-04-08: qty 1

## 2016-04-08 MED ORDER — ACETAMINOPHEN 325 MG PO TABS
650.0000 mg | ORAL_TABLET | ORAL | Status: DC | PRN
  Administered 2016-04-08: 650 mg via ORAL
  Filled 2016-04-08: qty 2

## 2016-04-08 MED ORDER — LISINOPRIL 20 MG PO TABS
40.0000 mg | ORAL_TABLET | Freq: Every day | ORAL | Status: DC
  Administered 2016-04-08 – 2016-04-09 (×2): 40 mg via ORAL
  Filled 2016-04-08 (×2): qty 2

## 2016-04-08 MED ORDER — ENOXAPARIN SODIUM 40 MG/0.4ML ~~LOC~~ SOLN
40.0000 mg | SUBCUTANEOUS | Status: DC
  Administered 2016-04-08: 40 mg via SUBCUTANEOUS
  Filled 2016-04-08: qty 0.4

## 2016-04-08 MED ORDER — PANTOPRAZOLE SODIUM 40 MG PO TBEC
40.0000 mg | DELAYED_RELEASE_TABLET | Freq: Every day | ORAL | Status: DC
  Administered 2016-04-09: 40 mg via ORAL
  Filled 2016-04-08: qty 1

## 2016-04-08 MED ORDER — MORPHINE SULFATE (PF) 4 MG/ML IV SOLN
4.0000 mg | Freq: Once | INTRAVENOUS | Status: AC
  Administered 2016-04-08: 4 mg via INTRAVENOUS
  Filled 2016-04-08: qty 1

## 2016-04-08 MED ORDER — GABAPENTIN 300 MG PO CAPS
300.0000 mg | ORAL_CAPSULE | Freq: Every day | ORAL | Status: DC
  Administered 2016-04-08: 300 mg via ORAL
  Filled 2016-04-08: qty 1

## 2016-04-08 MED ORDER — ONDANSETRON HCL 4 MG/2ML IJ SOLN
4.0000 mg | Freq: Once | INTRAMUSCULAR | Status: AC
  Administered 2016-04-08: 4 mg via INTRAVENOUS
  Filled 2016-04-08: qty 2

## 2016-04-08 MED ORDER — ONDANSETRON HCL 4 MG/2ML IJ SOLN: 4.0000 mg | Freq: Four times a day (QID) | INTRAMUSCULAR | Status: DC | PRN

## 2016-04-08 NOTE — ED Provider Notes (Addendum)
[facility] Emergency Department Provider Note   ____________________________________________   First MD Initiated Contact with Patient 04/08/16 1145     (approximate)  I have reviewed the triage vital signs and the nursing notes.   HISTORY  Chief Complaint Chest Pain    HPI [name] is a 46 y.o. male reports a previous history of high blood pressure, migraine headaches.  Today while at rest, patient was at home he began noticing a throbbing headache in the front that started at about 10 AM today. The headache steadily progressed, and he also began experiencing tightening feeling across his chest. He reports he had a moderate chest discomfort without shortness of breath but with associated nausea and feeling sweaty. The symptoms have resolved, but he is continued to have an increasing and 10 out of 10 severe frontal throbbing headache. He reports it feels like his blood pressure is high. He denies any visual changes. He had a tingling sensation in his fingers, of both hands but this has improved.  Presently reports no chest pain or trouble breathing. No ripping tearing or moving pain. Reports of the present time his only pain is 10 out of 10 severe bifrontal headache which she describes a "migraine". He does report similar headaches in the past, and has a history of "migraines".   Past Medical History:  Diagnosis Date  . Anxiety   . Chronic back pain   . Chronic neck pain   . Depression   . Diabetes mellitus without complication ([place])   . Enlarged heart    pt report  . Enlarged kidney Left  . GERD (gastroesophageal reflux disease)   . Headache, migraine   . Hypertension     There are no active problems to display for this patient.   History reviewed. No pertinent surgical history.  Prior to Admission medications   Medication Sig Start Date End Date Taking? Authorizing Provider  Butalbital-APAP-Caffeine (FIORICET PO) Take 1 tablet by mouth  daily.    Yes Historical Provider, MD  gabapentin (NEURONTIN) 300 MG capsule Take 300 mg by mouth at bedtime. 02/29/16  Yes Historical Provider, MD  lisinopril (PRINIVIL,ZESTRIL) 40 MG tablet Take 40 mg by mouth daily.   Yes Historical Provider, MD  metFORMIN (GLUCOPHAGE) 500 MG tablet Take by mouth 2 (two) times daily with a meal.   Yes Historical Provider, MD  metoprolol tartrate (LOPRESSOR) 25 MG tablet Take 25 mg by mouth 2 (two) times daily.   Yes Historical Provider, MD  Pantoprazole Sodium (PROTONIX PO) Take by mouth.   Yes Historical Provider, MD  sertraline (ZOLOFT) 50 MG tablet Take 50 mg by mouth daily.   Yes Historical Provider, MD  SUMAtriptan (IMITREX) 100 MG tablet Take 100 mg by mouth. May repeat in 2 hours if headache persists or recurs.    Historical Provider, MD    Allergies Patient has no known allergies.  History reviewed. No pertinent family history.  Social History Social History  Substance Use Topics  . Smoking status: Current Every Day Smoker  . Smokeless tobacco: Not on file  . Alcohol use Yes     Comment: rare    Review of Systems Constitutional: No fever/chills Eyes: No visual changes. ENT: No sore throat.No neck pain. Cardiovascular: See history of present illness Respiratory: Denies shortness of breath. Gastrointestinal: No abdominal pain.  No vomiting.  No diarrhea.  No constipation. Genitourinary: Negative for dysuria. Musculoskeletal: Negative for back pain. Skin: Negative for rash. Neurological: Negative for  focal  weakness or trouble speaking. No trouble with gait.  10-point ROS otherwise negative.  ____________________________________________   PHYSICAL EXAM:  VITAL SIGNS: ED Triage Vitals  Enc Vitals Group     BP 04/08/16 1140 (!) 182/111     Pulse Rate 04/08/16 1139 76     Resp 04/08/16 1139 (!) 28     Temp 04/08/16 1140 98.3 F (36.8 C)     Temp Source 04/08/16 1140 Oral     SpO2 04/08/16 1139 100 %     Weight 04/08/16 1137  185 lb (83.9 kg)     Height 04/08/16 1137 5\' 8"  (1.727 m)     Head Circumference --      Peak Flow --      Pain Score 04/08/16 1137 8     Pain Loc --      Pain Edu? --      Excl. in [place]? --     Constitutional: Alert and oriented. Appears in severe pain, wincing, has warm wet blanket over his face reporting a severe headache. Denies chest pain or shortness of breath Eyes: Conjunctivae are normal. PERRL. EOMI. notable photophobia. Ophthalmoscope exam demonstrates no retinal hemorrhage or vitreous hemorrhage. The posterior retina is clear bilateral.  Head: Atraumatic. Nose: No congestion/rhinnorhea. Mouth/Throat: Mucous membranes are moist.  Oropharynx non-erythematous. Neck: No stridor.  No meningismus. Full range of motion of neck without pain. Cardiovascular: Normal rate, regular rhythm. Grossly normal heart sounds.  Good peripheral circulation. Respiratory: Normal respiratory effort.  No retractions. Lungs CTAB. Gastrointestinal: Soft and nontender. No distention.  Musculoskeletal: No lower extremity tenderness nor edema.  No joint effusions. Neurologic:  Normal speech and language. No gross focal neurologic deficits are appreciated. No gait instability. Skin:  Skin is warm, dry and intact. No rash noted. Psychiatric: Mood and affect are normal. Speech and behavior are normal.  ____________________________________________   LABS (all labs ordered are listed, but only abnormal results are displayed)  Labs Reviewed  BASIC METABOLIC PANEL - Abnormal; Notable for the following:       Result Value   Glucose, Bld 110 (*)    All other components within normal limits  CBC - Abnormal; Notable for the following:    RDW 14.9 (*)    All other components within normal limits  TROPONIN I   ____________________________________________  EKG  Reviewed and interpreted by me at 11:46 AM Heart rate 70 Care is 90 QTc 4:30 Normal sinus rhythm, consistent with left ventricular hypertrophy  with mild ST changes noted in V2 and V3, most consistent with early repolarization abnormality ____________________________________________  RADIOLOGY  Ct Head Wo Contrast  Result Date: 04/08/2016 CLINICAL DATA:  Headache, nausea EXAM: CT HEAD WITHOUT CONTRAST TECHNIQUE: Contiguous axial images were obtained from the base of the skull through the vertex without intravenous contrast. COMPARISON:  08/26/2015 FINDINGS: Brain: No intracranial hemorrhage, mass effect or midline shift. No acute cortical infarction. No mass lesion is noted on this unenhanced scan. The gray and white-matter differentiation is preserved. Vascular: No hyperdense vessel or unexpected calcification. Skull: Normal. Negative for fracture or focal lesion. Sinuses/Orbits: No acute finding. Again noted chronic partial opacification of left mastoid air cells without change from prior exam. Other: None IMPRESSION: No acute intracranial abnormality. No definite acute cortical infarction. No significant change. Electronically Signed   By: Lahoma Crocker M.D.   On: 04/08/2016 12:15   Dg Chest Portable 1 View  Result Date: 04/08/2016 CLINICAL DATA:  Chest pain, nausea EXAM: PORTABLE CHEST 1 VIEW COMPARISON:  05/23/2013 FINDINGS: Heart and mediastinal contours are within normal limits. No focal opacities or effusions. No acute bony abnormality. IMPRESSION: No active disease. Electronically Signed   By: Rolm Baptise M.D.   On: 04/08/2016 12:00    ____________________________________________   PROCEDURES  Procedure(s) performed: None  Procedures  Critical Care performed: No  ____________________________________________   INITIAL IMPRESSION / ASSESSMENT AND PLAN / ED COURSE  Pertinent labs & imaging results that were available during my care of the patient were reviewed by me and considered in my medical decision making (see chart for details).  Patient presents with headache and chest pain associated with elevated blood pressure.  Somewhat unclear if headache is driving elevation of blood pressure, but he also reports running out of her lisinopril. He is awake alert with a reassuring neurologic examination. No evidence of acute stroke, posterior stroke, and he reports similar headaches occurring frequently and has been diagnosed previously as "migraines". Was not maximal in onset.  His chest pain is now resolved.  Clinical Course as of Apr 09 1402  Fri Apr 08, 2016  1347 Blood pressure currently 170/109, improving.  [MQ]  1403 Blood pressures improved to 162/110. Patient is been seen and evaluated for admission by the hospitalist.  [MQ]    Clinical Course User Index [MQ] Delman Kitten, MD   CT without contrast performed within 3 hours of symptomatology beginning. Negative CT with extremely high negative predictive value for intracranial hemorrhage/subarachnoid given acuity and performed within less than 4 hours from initiation of headache  ----------------------------------------- 12:51 PM on 04/08/2016 -----------------------------------------  Patient reports his headache is much better, he is not having any chest pain or discomfort. Troponin negative, he does have multiple cardiac risk factors including hypertension, a slight nonspecific T-wave change from previous, which I suspect is due to repolarization hour reality, and also presentation with chest tightness, history of hypertension. His blood pressure remains elevated despite improvement in his headache. I will give him his lisinopril and his home dose, and a small dose of hydralazine as well for interim blood pressure management.  Will admit patient to hospital for hypertensive urgency, as well as chest pain observation given multiple risk factors and elevated heart score. Both patient and his wife are agreeable with this plan. Aspirin administered as head CT is negative for acute hemorrhage. Patient reports pain improved, currently chest  pain..  ____________________________________________   FINAL CLINICAL IMPRESSION(S) / ED DIAGNOSES  Final diagnoses:  Moderate coronary artery risk chest pain  Hypertensive urgency      NEW MEDICATIONS STARTED DURING THIS VISIT:  New Prescriptions   No medications on file     Note:  This document was prepared using Dragon voice recognition software and may include unintentional dictation errors.     Delman Kitten, MD 04/08/16 1259    Delman Kitten, MD 04/08/16 [phone]

## 2016-04-08 NOTE — H&P (Signed)
[place] [facility] [place] NAME: [name]    MR#:  [id number]  DATE OF BIRTH:  12-07-1970  DATE OF ADMISSION:  04/08/2016  PRIMARY CARE PHYSICIAN: Pcp Not In System   REQUESTING/REFERRING PHYSICIAN: Delman Kitten, MD  CHIEF COMPLAINT:  Chest pain and headache  HISTORY OF PRESENT ILLNESS:  [name]  is a 46 y.o. male with a known history of Chronic migraine headaches, essential hypertension, diabetes mellitus is presenting to the ED with a chief complaint of chest pain and throbbing headache started at 10 AM today. Eventually patient started developing chest pain and worsening of the headache. Patient also reported that he ran out of his blood pressure medication lisinopril 2 days ago and couldn't refill on time. Chest x-ray and CT had a negative initial troponin is negative hospitalist team is called to admit the patient. EKG has revealed a nonspecific ST changes. Patient's chest pain is resolved during my examination still complaining of a headache, located in the frontal area and throbbing in nature  PAST MEDICAL HISTORY:   Past Medical History:  Diagnosis Date  . Anxiety   . Chronic back pain   . Chronic neck pain   . Depression   . Diabetes mellitus without complication ([place])   . Enlarged heart    pt report  . Enlarged kidney Left  . GERD (gastroesophageal reflux disease)   . Headache, migraine   . Hypertension     PAST SURGICAL HISTOIRY:  History reviewed. No pertinent surgical history.  SOCIAL HISTORY:   Social History  Substance Use Topics  . Smoking status: Current Every Day Smoker  . Smokeless tobacco: Not on file  . Alcohol use Yes     Comment: rare    FAMILY HISTORY:  Essential hypertension runs in his family  DRUG ALLERGIES:  No Known Allergies  REVIEW OF SYSTEMS:  CONSTITUTIONAL: No fever, fatigue or weakness.  EYES: No blurred or double vision.  EARS, NOSE, AND THROAT: No tinnitus or ear pain.  Patient is reporting headache RESPIRATORY: No cough, shortness of breath, wheezing or hemoptysis.  CARDIOVASCULAR: chest pain resolved during my examination, denies orthopnea, edema.  GASTROINTESTINAL: No nausea, vomiting, diarrhea or abdominal pain.  GENITOURINARY: No dysuria, hematuria.  ENDOCRINE: No polyuria, nocturia,  HEMATOLOGY: No anemia, easy bruising or bleeding SKIN: No rash or lesion. MUSCULOSKELETAL: No joint pain or arthritis.   NEUROLOGIC: No tingling, numbness, weakness.  PSYCHIATRY: No anxiety or depression.   MEDICATIONS AT HOME:   Prior to Admission medications   Medication Sig Start Date End Date Taking? Authorizing Provider  Butalbital-APAP-Caffeine (FIORICET PO) Take 1 tablet by mouth daily.    Yes Historical Provider, MD  gabapentin (NEURONTIN) 300 MG capsule Take 300 mg by mouth at bedtime. 02/29/16  Yes Historical Provider, MD  lisinopril (PRINIVIL,ZESTRIL) 40 MG tablet Take 40 mg by mouth daily.   Yes Historical Provider, MD  metFORMIN (GLUCOPHAGE) 500 MG tablet Take by mouth 2 (two) times daily with a meal.   Yes Historical Provider, MD  metoprolol tartrate (LOPRESSOR) 25 MG tablet Take 25 mg by mouth 2 (two) times daily.   Yes Historical Provider, MD  Pantoprazole Sodium (PROTONIX PO) Take by mouth.   Yes Historical Provider, MD  sertraline (ZOLOFT) 50 MG tablet Take 50 mg by mouth daily.   Yes Historical Provider, MD  SUMAtriptan (IMITREX) 100 MG tablet Take 100 mg by mouth. May repeat in 2 hours if headache persists or recurs.  Historical Provider, MD      VITAL SIGNS:  Blood pressure (!) 163/91, pulse 72, temperature 98.3 F (36.8 C), temperature source Oral, resp. rate 15, height 5\' 8"  (1.727 m), weight 83.9 kg (185 lb), SpO2 99 %.  PHYSICAL EXAMINATION:  GENERAL:  46 y.o.-year-old patient lying in the bed with no acute distress.  EYES: Pupils equal, round, reactive to light and accommodation. No scleral icterus. Extraocular muscles intact.  HEENT:  Head atraumatic, normocephalic. Oropharynx and nasopharynx clear.  NECK:  Supple, no jugular venous distention. No thyroid enlargement, no tenderness.  LUNGS: Normal breath sounds bilaterally, no wheezing, rales,rhonchi or crepitation. No use of accessory muscles of respiration.  CARDIOVASCULAR: S1, S2 normal. No murmurs, rubs, or gallops.  ABDOMEN: Soft, nontender, nondistended. Bowel sounds present. No organomegaly or mass.  EXTREMITIES: No pedal edema, cyanosis, or clubbing.  NEUROLOGIC: Cranial nerves II through XII are intact. Muscle strength 5/5 in all extremities. Sensation intact. Gait not checked.  PSYCHIATRIC: The patient is alert and oriented x 3.  SKIN: No obvious rash, lesion, or ulcer.   LABORATORY PANEL:   CBC  Recent Labs Lab 04/08/16 1143  WBC 8.4  HGB 14.2  HCT 44.1  PLT 338   ------------------------------------------------------------------------------------------------------------------  Chemistries   Recent Labs Lab 04/08/16 1143  NA 141  K 3.8  CL 105  CO2 27  GLUCOSE 110*  BUN 14  CREATININE 1.06  CALCIUM 9.9   ------------------------------------------------------------------------------------------------------------------  Cardiac Enzymes  Recent Labs Lab 04/08/16 1143  TROPONINI <0.03   ------------------------------------------------------------------------------------------------------------------  RADIOLOGY:  Ct Head Wo Contrast  Result Date: 04/08/2016 CLINICAL DATA:  Headache, nausea EXAM: CT HEAD WITHOUT CONTRAST TECHNIQUE: Contiguous axial images were obtained from the base of the skull through the vertex without intravenous contrast. COMPARISON:  08/26/2015 FINDINGS: Brain: No intracranial hemorrhage, mass effect or midline shift. No acute cortical infarction. No mass lesion is noted on this unenhanced scan. The gray and white-matter differentiation is preserved. Vascular: No hyperdense vessel or unexpected calcification. Skull:  Normal. Negative for fracture or focal lesion. Sinuses/Orbits: No acute finding. Again noted chronic partial opacification of left mastoid air cells without change from prior exam. Other: None IMPRESSION: No acute intracranial abnormality. No definite acute cortical infarction. No significant change. Electronically Signed   By: Lahoma Crocker M.D.   On: 04/08/2016 12:15   Dg Chest Portable 1 View  Result Date: 04/08/2016 CLINICAL DATA:  Chest pain, nausea EXAM: PORTABLE CHEST 1 VIEW COMPARISON:  05/23/2013 FINDINGS: Heart and mediastinal contours are within normal limits. No focal opacities or effusions. No acute bony abnormality. IMPRESSION: No active disease. Electronically Signed   By: Rolm Baptise M.D.   On: 04/08/2016 12:00    EKG:   Orders placed or performed during the hospital encounter of 04/08/16  . ED EKG within 10 minutes  . ED EKG within 10 minutes  . EKG 12-Lead  . EKG 12-Lead  . EKG 12-Lead  . EKG 12-Lead    IMPRESSION AND PLAN:  Ozro Klepacki  is a 46 y.o. male with a known history of Chronic migraine headaches, essential hypertension, diabetes mellitus is presenting to the ED with a chief complaint of chest pain and throbbing headache started at 10 AM today. Eventually patient started developing chest pain and worsening of the headache. Patient also reported that he ran out of his blood pressure medication lisinopril 2 days ago and couldn't refill on time. Chest x-ray and CT had a negative initial troponin is negative hospitalist team is called to  admit the patient. EKG has revealed a nonspecific ST changes  #Chest pain Admit to telemetry Cycle cardiac biomarkers Provide aspirin and beta blockers metoprolol 20 mg by mouth twice a day Check lipid panel in a.m. Continue home medication lisinopril  #Hypertensive urgency Patient is reporting chest pain and headache Resume his home medication metoprolol and lisinopril and titrate as needed IV Lopressor as needed with holding  parameters  #History of migraine headaches, could be aggravated by elevated blood pressure Imitrex Fioricet as needed Resume his home medications for blood pressure  #Diabetes mellitus Continue home medication metformin and sliding scale insulin Neurontin for diabetic neuropathy   DVT prophylaxis   All the records are reviewed and case discussed with ED provider. Management plans discussed with the patient, family and they are in agreement.  CODE STATUS: Full code, wife is the healthcare power of attorney  TOTAL TIME TAKING CARE OF THIS PATIENT: 12minutes.   Note: This dictation was prepared with Dragon dictation along with smaller phrase technology. Any transcriptional errors that result from this process are unintentional.  Nicholes Mango M.D on 04/08/2016 at 4:42 PM  Between 7am to 6pm - Pager - [phone]  After 6pm go to [email or web address] - password [email or web address] [facility]  Office  [phone]  CC: Primary care physician; Pcp Not In System

## 2016-04-08 NOTE — ED Triage Notes (Signed)
Pt started with acute central intermittent chest pain today. Noted to be diaphoretic. Has had nausea as well. C/o HTN and migraine as well.  Pale.

## 2016-04-08 NOTE — ED Notes (Signed)
Pt transported to CT by [facility]

## 2016-04-09 ENCOUNTER — Observation Stay: Admit: 2016-04-09 | Payer: Medicare Other

## 2016-04-09 LAB — GLUCOSE, CAPILLARY: Glucose-Capillary: 98 mg/dL (ref 65–99)

## 2016-04-09 LAB — HIV ANTIBODY (ROUTINE TESTING W REFLEX): HIV SCREEN 4TH GENERATION: NONREACTIVE

## 2016-04-09 NOTE — Discharge Summary (Signed)
[facility] [place] NAME: [name]    MR#:  [id number]  DATE OF BIRTH:  07-Jan-1971  DATE OF ADMISSION:  04/08/2016 ADMITTING PHYSICIAN: Nicholes Mango, MD  DATE OF DISCHARGE: 04/09/2016  9:25 AM  PRIMARY CARE PHYSICIAN: Only saw his PCP once   ADMISSION DIAGNOSIS:  Hypertensive urgency [I16.0] Moderate coronary artery risk chest pain [R07.9]  DISCHARGE DIAGNOSIS:  Active Problems:   Chest pain   SECONDARY DIAGNOSIS:   Past Medical History:  Diagnosis Date  . Anxiety   . Chronic back pain   . Chronic neck pain   . Depression   . Diabetes mellitus without complication ([place])   . Enlarged heart    pt report  . Enlarged kidney Left  . GERD (gastroesophageal reflux disease)   . Headache, migraine   . Hypertension     HOSPITAL COURSE:   1. Accelerated hypertension. Patient initially complained of chest pain and headache. The patient admits to not taking his medications on a daily basis. He was restarted on his home dose metoprolol and lisinopril and blood pressure did come down into the normal range. I advised he must take his blood pressure medications as prescribed. He states he will follow-up with his primary care physician annually seen one time before and does not know his name. 2. Chest pain cardiac enzymes were negative 3. I offered the patient a stress test but the patient declined since he was feeling better. 3. Headache. Likely secondary to accelerated hypertension. Patient was headache free upon discharge. Follow-up with Dr. Melrose Nakayama neurology as outpatient. 4. GERD on Protonix 5. Type 2 diabetes mellitus controlled back on Glucophage as outpatient 6. Depression and anxiety on Zoloft 7. Chronic pain  DISCHARGE CONDITIONS:   Satisfactory  CONSULTS OBTAINED:   none  DRUG ALLERGIES:  No Known Allergies  DISCHARGE MEDICATIONS:   Discharge Medication List as of 04/09/2016  8:26 AM    CONTINUE these medications which  have NOT CHANGED   Details  Butalbital-APAP-Caffeine (FIORICET PO) Take 1 tablet by mouth daily. , Historical Med    gabapentin (NEURONTIN) 300 MG capsule Take 300 mg by mouth at bedtime., Starting Mon 02/29/2016, Historical Med    lisinopril (PRINIVIL,ZESTRIL) 40 MG tablet Take 40 mg by mouth daily., Historical Med    metFORMIN (GLUCOPHAGE) 500 MG tablet Take by mouth 2 (two) times daily with a meal., Historical Med    metoprolol tartrate (LOPRESSOR) 25 MG tablet Take 25 mg by mouth 2 (two) times daily., Historical Med    Pantoprazole Sodium (PROTONIX PO) Take by mouth., Historical Med    sertraline (ZOLOFT) 50 MG tablet Take 50 mg by mouth daily., Historical Med    SUMAtriptan (IMITREX) 100 MG tablet Take 100 mg by mouth. May repeat in 2 hours if headache persists or recurs., Historical Med         DISCHARGE INSTRUCTIONS:     If you experience worsening of your admission symptoms, develop shortness of breath, life threatening emergency, suicidal or homicidal thoughts you must seek medical attention immediately by calling 911 or calling your MD immediately  if symptoms less severe.  You Must read complete instructions/literature along with all the possible adverse reactions/side effects for all the Medicines you take and that have been prescribed to you. Take any new Medicines after you have completely understood and accept all the possible adverse reactions/side effects.   Please note  You were cared for by a hospitalist during your hospital stay.  If you have any questions about your discharge medications or the care you received while you were in the hospital after you are discharged, you can call the unit and asked to speak with the hospitalist on call if the hospitalist that took care of you is not available. Once you are discharged, your primary care physician will handle any further medical issues. Please note that NO REFILLS for any discharge medications will be authorized once  you are discharged, as it is imperative that you return to your primary care physician (or establish a relationship with a primary care physician if you do not have one) for your aftercare needs so that they can reassess your need for medications and monitor your lab values.    Today   CHIEF COMPLAINT:   Chief Complaint  Patient presents with  . Chest Pain    HISTORY OF PRESENT ILLNESS:  [name]  is a 46 y.o. male with a known history of Hypertension presented with chest pain and headache and found to have accelerated hypertension   VITAL SIGNS:  Blood pressure 122/74, pulse 72, temperature 98.3 F (36.8 C), temperature source Oral, resp. rate 18, height 5\' 8"  (1.727 m), weight 83.9 kg (185 lb), SpO2 98 %.    PHYSICAL EXAMINATION:  GENERAL:  46 y.o.-year-old patient lying in the bed with no acute distress.  EYES: Pupils equal, round, reactive to light and accommodation. No scleral icterus. Extraocular muscles intact.  HEENT: Head atraumatic, normocephalic. Oropharynx and nasopharynx clear. Tympanic membrane no erythema NECK:  Supple, no jugular venous distention. No thyroid enlargement, no tenderness.  LUNGS: Normal breath sounds bilaterally, no wheezing, rales,rhonchi or crepitation. No use of accessory muscles of respiration.  CARDIOVASCULAR: S1, S2 normal. No murmurs, rubs, or gallops.  ABDOMEN: Soft, non-tender, non-distended. Bowel sounds present. No organomegaly or mass.  EXTREMITIES: No pedal edema, cyanosis, or clubbing.  NEUROLOGIC: Cranial nerves II through XII are intact. Muscle strength 5/5 in all extremities. Sensation intact. Gait slow but steady no signs of falling. PSYCHIATRIC: The patient is alert and oriented x 3.  SKIN: No obvious rash, lesion, or ulcer.   DATA REVIEW:   CBC  Recent Labs Lab 04/08/16 1143  WBC 8.4  HGB 14.2  HCT 44.1  PLT 338    Chemistries   Recent Labs Lab 04/08/16 1143  NA 141  K 3.8  CL 105  CO2 27  GLUCOSE 110*   BUN 14  CREATININE 1.06  CALCIUM 9.9    Cardiac Enzymes  Recent Labs Lab 04/08/16 2231  TROPONINI <0.03     RADIOLOGY:  Ct Head Wo Contrast  Result Date: 04/08/2016 CLINICAL DATA:  Headache, nausea EXAM: CT HEAD WITHOUT CONTRAST TECHNIQUE: Contiguous axial images were obtained from the base of the skull through the vertex without intravenous contrast. COMPARISON:  08/26/2015 FINDINGS: Brain: No intracranial hemorrhage, mass effect or midline shift. No acute cortical infarction. No mass lesion is noted on this unenhanced scan. The gray and white-matter differentiation is preserved. Vascular: No hyperdense vessel or unexpected calcification. Skull: Normal. Negative for fracture or focal lesion. Sinuses/Orbits: No acute finding. Again noted chronic partial opacification of left mastoid air cells without change from prior exam. Other: None IMPRESSION: No acute intracranial abnormality. No definite acute cortical infarction. No significant change. Electronically Signed   By: Lahoma Crocker M.D.   On: 04/08/2016 12:15   Dg Chest Portable 1 View  Result Date: 04/08/2016 CLINICAL DATA:  Chest pain, nausea EXAM: PORTABLE CHEST 1 VIEW COMPARISON:  05/23/2013 FINDINGS: Heart and mediastinal contours are within normal limits. No focal opacities or effusions. No acute bony abnormality. IMPRESSION: No active disease. Electronically Signed   By: Rolm Baptise M.D.   On: 04/08/2016 12:00      Management plans discussed with the patient, family and He is in agreement.  CODE STATUS:  Code Status History    Date Active Date Inactive Code Status Order ID Comments User Context   04/08/2016  4:33 PM 04/09/2016 12:30 PM Full Code [id number]  Nicholes Mango, MD Inpatient      TOTAL TIME TAKING CARE OF THIS PATIENT: 35 minutes.    Loletha Grayer M.D on 04/09/2016 at 3:43 PM  Between 7am to 6pm - Pager - [phone]  After 6pm go to [email or web address] - password [email or web address]  [facility] Office   [phone]  CC: Primary care physician; Pcp Not In System

## 2016-04-09 NOTE — Progress Notes (Signed)
Patient alert and oriented, vss, no complaints of pain.  D/C tele and PIV.  No new medications.  Patient to be escorted out of hospital via wheelchair by volunteers.

## 2016-04-09 NOTE — Care Management Obs Status (Signed)
[facility] NOTIFICATION   Patient Details  Name: [name] MRN: [id number] Date of Birth: 02-14-1970   Medicare Observation Status Notification Given:  No (discharge order in less than 24 hours)    Ival Bible, RN 04/09/2016, 8:30 AM

## 2016-04-12 ENCOUNTER — Ambulatory Visit: Payer: Medicare Other | Admitting: Physical Therapy

## 2016-04-14 ENCOUNTER — Ambulatory Visit: Payer: Medicare Other | Attending: Physical Medicine and Rehabilitation | Admitting: Physical Therapy

## 2016-04-14 ENCOUNTER — Ambulatory Visit: Payer: Medicare Other | Admitting: Physical Therapy

## 2016-04-14 DIAGNOSIS — M545 Low back pain: Secondary | ICD-10-CM | POA: Insufficient documentation

## 2016-04-14 DIAGNOSIS — M542 Cervicalgia: Secondary | ICD-10-CM | POA: Insufficient documentation

## 2016-04-14 DIAGNOSIS — R262 Difficulty in walking, not elsewhere classified: Secondary | ICD-10-CM

## 2016-04-14 DIAGNOSIS — M6281 Muscle weakness (generalized): Secondary | ICD-10-CM

## 2016-04-14 DIAGNOSIS — G8929 Other chronic pain: Secondary | ICD-10-CM

## 2016-04-14 DIAGNOSIS — M5412 Radiculopathy, cervical region: Secondary | ICD-10-CM

## 2016-04-14 DIAGNOSIS — M5416 Radiculopathy, lumbar region: Secondary | ICD-10-CM | POA: Insufficient documentation

## 2016-04-15 NOTE — Therapy (Signed)
[facility] MAIN [facility] SERVICES [address] [place], [place], [address] Phone: [phone]   Fax:  [phone]  Patient Details  Name: [name] MRN: [id number] Date of Birth: 07-21-1970 Referring Provider:  Sharlet Salina, MD  Encounter Date: 04/14/2016  Therapy Note:  Pt arrived to PT reporting he was hospitalized last week due to high BP (190/175mm Hg)  and extreme headache. Pt has been put on medications which have helped to normalize his BP. Today's BP 139/98 mm Hg, 130/90 mm Hg.  Deferred PT treatment session today due needing MD clearance for PT. Awaiting clearance from PCP (Dr. Ladoris Gene)  and assisted pt in getting an appt on 3/12 with PCP. Pt will  f/u with MRI on 3/10 which was ordered by Dr. Melrose Nakayama (neurologist).    Pt also reported he had not contacted his MD re: high BP as advised by his PTs last week. Prior to his ER visit for elevated BP, pt reported he had spilled his pill box and and in hindsight, realized he had missed his BP medication. Pt was also provided a chart to fill in his medications and to take a picture of his pills next to the its name to refer back to for compliance.  Pt was advised to stay compliant with his medications and to communicate with his MD on his medications and other medical concerns.   Provided active listening to pt's feeling overwhelmed on his disability and its impact on his personal and family life. Pt was advised to continue seeking support from an interdisciplinary team of providers to address his medical, psychosocial, and physical issues.  Pt voiced he is interested in learning ways to handle stress and to improve sleep. Pt was provided the following resources: referral to psychotherapist [facility]) / psychiatrist [facility]) and general information/website links on mindfulness technique and improving sleep.  Encouraged pt to continue monitoring his BP daily.  Pt voiced encouragement and understanding.    100% of session was education 1 hr duration 13:30-14:30 . No charges were  billed.   Jerl Mina ,PT, DPT, E-RYT  04/15/2016, 6:34 PM  [facility] MAIN [facility] SERVICES [address] [place], [place], [address] Phone: [phone]   Fax:  [phone]

## 2016-04-16 ENCOUNTER — Ambulatory Visit
Admission: RE | Admit: 2016-04-16 | Discharge: 2016-04-16 | Disposition: A | Discharge status: Home / Self Care | Payer: Medicare Other | Source: Ambulatory Visit | Attending: Neurology | Admitting: Neurology

## 2016-04-16 DIAGNOSIS — G43019 Migraine without aura, intractable, without status migrainosus: Secondary | ICD-10-CM | POA: Diagnosis not present

## 2016-04-19 ENCOUNTER — Ambulatory Visit: Payer: Medicare Other | Admitting: Physical Therapy

## 2016-04-19 ENCOUNTER — Telehealth: Payer: Self-pay

## 2016-04-19 ENCOUNTER — Ambulatory Visit: Payer: Medicare Other

## 2016-04-19 ENCOUNTER — Telehealth: Payer: Self-pay | Admitting: Physical Therapy

## 2016-04-19 NOTE — Telephone Encounter (Signed)
Aquatic PT phoned and left message with Dr. Fletcher Anon RN providing fax number to [facility] [phone] to fax over clearance to return to PT.

## 2016-04-19 NOTE — Telephone Encounter (Signed)
  Called patient who said that he has an MD appointment today 04/19/2016 at 2:40 pm with Dr. Ladoris Gene. Will try to get a written MD clearance to continue PT. Pt states that he is doing ok. Feeling better since going to the hospital. Will be able to make it to his next scheduled appointment on Tuesday 04/26/2016 at 8:15 am

## 2016-04-21 ENCOUNTER — Ambulatory Visit: Payer: Medicare Other | Admitting: Physical Therapy

## 2016-04-26 ENCOUNTER — Telehealth: Payer: Self-pay

## 2016-04-26 ENCOUNTER — Ambulatory Visit: Payer: Medicare Other | Admitting: Physical Therapy

## 2016-04-26 ENCOUNTER — Ambulatory Visit: Payer: Medicare Other

## 2016-04-26 NOTE — Telephone Encounter (Signed)
  No show. Called patient and left a message at his home phone (mobile phone voice mailbox not set up yet) asking about how he is doing, as well as informing him about today's scheduled appointment and a reminder for his next follow up session. Return phone call requested. Phone number [phone]) provided.

## 2016-04-28 ENCOUNTER — Ambulatory Visit: Payer: Medicare Other | Admitting: Physical Therapy

## 2016-04-28 ENCOUNTER — Telehealth: Payer: Self-pay

## 2016-04-28 ENCOUNTER — Ambulatory Visit: Payer: Medicare Other

## 2016-04-28 NOTE — Telephone Encounter (Signed)
No show. Called patient and left a message pertaining to his current session and a reminder for his next follow up appointment. Return phone call requested. Phone number [phone]) provided.

## 2016-05-03 ENCOUNTER — Ambulatory Visit: Payer: Medicare Other

## 2016-05-03 ENCOUNTER — Ambulatory Visit: Payer: Medicare Other | Admitting: Physical Therapy

## 2016-05-03 DIAGNOSIS — M545 Low back pain: Principal | ICD-10-CM

## 2016-05-03 DIAGNOSIS — G8929 Other chronic pain: Secondary | ICD-10-CM | POA: Diagnosis present

## 2016-05-03 DIAGNOSIS — R262 Difficulty in walking, not elsewhere classified: Secondary | ICD-10-CM | POA: Diagnosis present

## 2016-05-03 DIAGNOSIS — M542 Cervicalgia: Secondary | ICD-10-CM | POA: Diagnosis present

## 2016-05-03 DIAGNOSIS — M5412 Radiculopathy, cervical region: Secondary | ICD-10-CM

## 2016-05-03 DIAGNOSIS — M5416 Radiculopathy, lumbar region: Secondary | ICD-10-CM | POA: Diagnosis present

## 2016-05-03 DIAGNOSIS — M6281 Muscle weakness (generalized): Secondary | ICD-10-CM | POA: Diagnosis present

## 2016-05-03 NOTE — Patient Instructions (Signed)
    Hold tubing in right hand, arm out. Pull arm to your side. Do not twist or rotate trunk.    Repeat ___5_ times per set. Do 5__ sets per session. Do _1___ sessions per day.  http://orth.exer.us/834   Copyright  VHI. All rights reserved.

## 2016-05-03 NOTE — Therapy (Signed)
[facility] [address], [place], [address] Phone: [phone]   Fax:  [phone]  Physical Therapy Treatment  Patient Details  Name: [name] MRN: [id number] Date of Birth: 10-20-1970 Referring Provider: Sharlet Salina, DO  Encounter Date: 05/03/2016      PT End of Session - 05/03/16 0906    Visit Number 7   Number of Visits 17   Date for PT Re-Evaluation 05/05/16   Authorization Type 7   Authorization Time Period of 10 g code   PT Start Time 0906   PT Stop Time 0951   PT Time Calculation (min) 45 min   Activity Tolerance Patient limited by pain   Behavior During Therapy [facility] for tasks assessed/performed      Past Medical History:  Diagnosis Date  . Anxiety   . Chronic back pain   . Chronic neck pain   . Depression   . Diabetes mellitus without complication ([place])   . Enlarged heart    pt report  . Enlarged kidney Left  . GERD (gastroesophageal reflux disease)   . Headache, migraine   . Hypertension     No past surgical history on file.  There were no vitals filed for this visit.      Subjective Assessment - 05/03/16 0912    Subjective Back is ok. Has been using his TENS unit which has been helping some. Not as powerfull as the one in the clinic. Helps some, not as much as I thought it would. 7/10 low back (8/10 at most for the past 7 days).  Neck feels ok right now. Stiffness on L (latral neck) more than the R, 6/10 currently (5-6/10 at most for the past 7 days).  Pt states that he has to get in touch with the aqua therapist but has a difficulty getting to his email to get in touch with pshychologist/psychiatrist. Dr Reather Laurence also recommended pt to a location as well for psychologist/social worker.    Currently in Pain? Yes   Pain Score 7                                  PT Education - 05/03/16 0935    Education provided Yes   Education Details ther-ex,  HEP   Person(s) Educated Patient   Methods Demonstration;Explanation;Tactile cues;Verbal cues;Handout   Comprehension Verbalized understanding;Returned demonstration        Objectives  April 21, 2015: [name] received a call from Shanon Ace from Dr. Felicity Pellegrini office that he [facility]) has clearance to continue his PT since his hospitalization   Sitting posture: L lateral shift   There-ex   Vitals obtained Blood pressure, L arm sitting: 131/94, HR 91 bpm, mechanically taken  Sitting up tall, then gently resting 5x3to promote gentle back movement.  standing R shoulder adduction (gentle) resisting yellow band 5x5. Back felt good per pt. Slight R shoulder discomfort.   Reviewed and given as part of his HEP. Pt demonstrated and verbalized understanding.    standing bilateral scapular retraction 5x3  Seated gentle hip adduction ball squeeze with glute max squeeze 5x2  Rest breaks provided to allow body to recover.   Printed email aqua therapist sent to pt (and to me) pertianing to physcologist information and gave to patient secondary to pt stating having difficulty accessing the email.    Improved exercise technique, movement at target joints, use of  target muscles after mod verbal, visual, tactile cues.    Pt demonstrates decreased neck pain and slight decreased back pain at worst since initial evaluation. No increased low back pain with R shoulder adduction. Worked on gentle movements, strengthening and posture.            PT Long Term Goals - 03/08/16 1310      PT LONG TERM GOAL #1   Title Patient will improve his Modified Oswestry Low Back Pain Disability Questionnaire score by at least 12% as a demonstration of improved function.    Baseline 70% (03/08/2016)   Time 8   Period Weeks   Status New     PT LONG TERM GOAL #2   Title Patient will improve his Quick Dash Disability/Symptom Score by at least 15% as a demonstration of improved function.     Baseline 79.55% (03/08/2016)   Time 8   Period Weeks   Status New     PT LONG TERM GOAL #3   Title Patient will have a decrease in back pain to 5/10 or less at worst to promote ability to perform functional tasks.    Baseline 8/10 back pain at worst (03/08/2016)   Time 8   Period Weeks   Status New     PT LONG TERM GOAL #4   Title Patient will have a decrease in neck pain to 5/10 or less at worst to promote ability to perform functional tasks, turn his head.    Baseline 8/10 neck pain at worst (03/08/2016)   Time 8   Period Weeks   Status New               Plan - 05/03/16 0910    Clinical Impression Statement Pt demonstrates decreased neck pain and slight decreased back pain at worst since initial evaluation. No increased low back pain with R shoulder adduction. Worked on gentle movements, strengthening and posture.    Rehab Potential Fair   Clinical Impairments Affecting Rehab Potential chronicity of condition, pain   PT Frequency 2x / week   PT Duration 8 weeks   PT Treatment/Interventions Aquatic Therapy;Manual techniques;Dry needling;Patient/family education;Neuromuscular re-education;Therapeutic exercise;Therapeutic activities;Cryotherapy;Ultrasound;Traction;Moist Heat;Iontophoresis 4mg /ml Dexamethasone;Electrical Stimulation;Gait training;Functional mobility training;Balance training   PT Next Visit Plan Modalities PRN for pain control, gentle strengthening, function, aquatic therapy (when available)   Consulted and Agree with Plan of Care Patient      Patient will benefit from skilled therapeutic intervention in order to improve the following deficits and impairments:  Pain, Postural dysfunction, Improper body mechanics, Difficulty walking, Decreased strength, Decreased range of motion, Abnormal gait  Visit Diagnosis: Chronic bilateral low back pain, with sciatica presence unspecified  Difficulty in walking, not elsewhere classified  Muscle weakness  (generalized)  Radiculopathy, lumbar region  Cervicalgia  Radiculopathy, cervical region     Problem List Patient Active Problem List   Diagnosis Date Noted  . Chest pain 04/08/2016    Joneen Boers PT, DPT  05/03/2016, 4:39 PM  [facility] [address], [place], [address] Phone: [phone]   Fax:  [phone]  Name: [name] MRN: [id number] Date of Birth: Dec 19, 1970

## 2016-05-05 ENCOUNTER — Ambulatory Visit: Payer: Medicare Other | Admitting: Physical Therapy

## 2016-05-05 ENCOUNTER — Telehealth: Payer: Self-pay

## 2016-05-05 ENCOUNTER — Ambulatory Visit: Payer: Medicare Other

## 2016-05-05 NOTE — Telephone Encounter (Signed)
No show. Called patient home phone number (cell phone voice mailbox not set up) pertaining to today's scheduled appointment and a reminder for his next 2 follow up sessions (05/10/2016 at 9:45 am and 05/12/2016 at 11:15 am). Return phone call requested. Phone number [phone]) provided.

## 2016-05-10 ENCOUNTER — Ambulatory Visit: Payer: Medicare Other | Attending: Physical Medicine and Rehabilitation

## 2016-05-10 ENCOUNTER — Ambulatory Visit: Payer: Medicare Other | Admitting: Physical Therapy

## 2016-05-10 DIAGNOSIS — M542 Cervicalgia: Secondary | ICD-10-CM

## 2016-05-10 DIAGNOSIS — R262 Difficulty in walking, not elsewhere classified: Secondary | ICD-10-CM

## 2016-05-10 DIAGNOSIS — M5412 Radiculopathy, cervical region: Secondary | ICD-10-CM | POA: Diagnosis present

## 2016-05-10 DIAGNOSIS — M545 Low back pain: Secondary | ICD-10-CM | POA: Insufficient documentation

## 2016-05-10 DIAGNOSIS — G8929 Other chronic pain: Secondary | ICD-10-CM | POA: Insufficient documentation

## 2016-05-10 DIAGNOSIS — M6281 Muscle weakness (generalized): Secondary | ICD-10-CM | POA: Diagnosis present

## 2016-05-10 DIAGNOSIS — M5416 Radiculopathy, lumbar region: Secondary | ICD-10-CM | POA: Insufficient documentation

## 2016-05-10 NOTE — Therapy (Signed)
[facility] [address], [place], [address] Phone: [phone]   Fax:  [phone]  Physical Therapy Treatment And Progress Report  Patient Details  Name: [name] MRN: [id number] Date of Birth: 17-May-1970 Referring Provider: Sharlet Salina, DO  Encounter Date: 05/10/2016      PT End of Session - 05/10/16 0946    Visit Number 8   Number of Visits 17   Date for PT Re-Evaluation 06/23/16   Authorization Type 1   Authorization Time Period of 10 g code   PT Start Time 0948   PT Stop Time 1029   PT Time Calculation (min) 41 min   Activity Tolerance Patient limited by pain   Behavior During Therapy [facility] for tasks assessed/performed      Past Medical History:  Diagnosis Date  . Anxiety   . Chronic back pain   . Chronic neck pain   . Depression   . Diabetes mellitus without complication ([place])   . Enlarged heart    pt report  . Enlarged kidney Left  . GERD (gastroesophageal reflux disease)   . Headache, migraine   . Hypertension     No past surgical history on file.  There were no vitals filed for this visit.      Subjective Assessment - 05/10/16 0952    Subjective Pt states taking his blood pressure medication this morning. Back has been hurting him real bad on his L side.  Has not been able to get much sleep. 10/10 back pain currently. Neck is so-so. 6-7/10 stiffness currently.  Tried his band exercise.  L hand tingles if he grips too long. Some discomfort mid back.  6-7/10 L lateral neck pain at most for the past 7 days.  Pt states that he has not called the phsychologist yet.  Pt states that the blood pressure medication helped. Was just in a lot of pain the past few days. Did a lot of sitting at the head of the bed.    Patient Stated Goals Want the pain to go away. Walk a little better, stand a little bit longer (longer than 5 min).   Currently in Pain? Yes   Pain Score 10-Worst pain ever             [facility] PT Assessment - 05/10/16 1230      Observation/Other Assessments   Modified Oswertry 74%   Quick DASH  93%                             PT Education - 05/10/16 1231    Education provided Yes   Education Details positions, sitting upright posture, instead of slouched posture at head of bed.   Person(s) Educated Patient   Methods Explanation;Demonstration;Tactile cues;Verbal cues   Comprehension Returned demonstration;Verbalized understanding        Objectives  Pt states that the blood pressure medicaiton helped. Was just in a lot of pain for the past few days. Did a lot of sitting at the head of the bed.    Therapeutic activity  Vitals obtained Blood pressure, L arm sitting: 155/102, HR 122, mechanically taken 153/106 after about 5 minutes rest  Sitting with lumbar towel roll  Sitting with pillow under L hip. Increased low back pressure  Sitting with pillow under R hip. Eased symptoms.   Positions performed to try to help decrease back pain.  Pt was recommended to  take a break from sitting at the head of the bed due to back posture and sit at his upright chair. Also to take standing breaks. Pt verbalized understanding.    Reviewed plan of care: 2x/week x 6 weeks     High Volt E-stim per pt request secondary to pain. Pt states that the unit here helps.    15 min for pain control. Channel 1: R low back and paraspinal 155 V  Channel 2: L low back and paraspinal 155 V   Manual therapy  STM R upper trap. Slight decrease in tension. No change in neck pain per pt.     Pt demonstrates slight decrease in neck pain levels at worst since initial evaluation. Back pain levels however have been elevated for the past few days with pt reporting that he has done more sitting towards the head of the bed than usual. Pt was recommended to sit in a more upright posture such as at his chair with a lumbar support as well as take  standing breaks since flexion tends to increase his symptoms and that sitting at the head of the bed has a tendency to place his back in that posture. Pt verbalized understanding. Currently unable to participate in aquatic therapy secondary to elevated blood pressure levels (153/106 L arm sitting today, asymptomatic) and that water pressure may increase his levels. Exercises not performed today secondary to diastolic blood pressure levels not appropriate for exercises. Patient still demonstrates neck and back pain, poor posture, weakness, and difficulty performing functional tasks such as walking and lifting and would benefit from skilled physical therapy services to address the aforementioned deficits.            PT Long Term Goals - 05/10/16 1314      PT LONG TERM GOAL #1   Title Patient will improve his Modified Oswestry Low Back Pain Disability Questionnaire score by at least 12% as a demonstration of improved function.    Baseline 70% (03/08/2016); 74% (05/10/2016)   Time 6   Period Weeks   Status On-going     PT LONG TERM GOAL #2   Title Patient will improve his Quick Dash Disability/Symptom Score by at least 15% as a demonstration of improved function.    Baseline 79.55% (03/08/2016); 93% (05/10/2016)   Time 6   Period Weeks   Status On-going     PT LONG TERM GOAL #3   Title Patient will have a decrease in back pain to 5/10 or less at worst to promote ability to perform functional tasks.    Baseline 8/10 back pain at worst (03/08/2016); 10/10 (05/10/2016)   Time 6   Period Weeks   Status On-going     PT LONG TERM GOAL #4   Title Patient will have a decrease in neck pain to 5/10 or less at worst to promote ability to perform functional tasks, turn his head.    Baseline 8/10 neck pain at worst (03/08/2016); 6-7/10 neck pain at worst for the past 7 days (05/10/2016)   Time 6   Period Weeks   Status On-going               Plan - 05/10/16 0949    Clinical Impression Statement  Pt demonstrates slight decrease in neck pain levels at worst since initial evaluation. Back pain levels however have been elevated for the past few days with pt reporting that he has done more sitting towards the head of the bed than usual. Pt was  recommended to sit in a more upright posture such as at his chair with a lumbar support as well as take standing breaks since flexion tends to increase his symptoms and that sitting at the head of the bed has a tendency to place his back in that posture. Pt verbalized understanding. Currently unable to participate in aquatic therapy secondary to elevated blood pressure levels (153/106 L arm sitting today, asymptomatic) and that water pressure may increase his levels. Exercises not performed today secondary to diastolic blood pressure levels not appropriate for exercises. Patient still demonstrates neck and back pain, poor posture, weakness, and difficulty performing functional tasks such as walking and lifting and would benefit from skilled physical therapy services to address the aforementioned deficits.    Rehab Potential Fair   Clinical Impairments Affecting Rehab Potential chronicity of condition, pain   PT Frequency 2x / week   PT Duration 8 weeks   PT Treatment/Interventions Aquatic Therapy;Manual techniques;Dry needling;Patient/family education;Neuromuscular re-education;Therapeutic exercise;Therapeutic activities;Cryotherapy;Ultrasound;Traction;Moist Heat;Iontophoresis 4mg /ml Dexamethasone;Electrical Stimulation;Gait training;Functional mobility training;Balance training   PT Next Visit Plan Modalities PRN for pain control, gentle strengthening, function, aquatic therapy (when available)   Consulted and Agree with Plan of Care Patient      Patient will benefit from skilled therapeutic intervention in order to improve the following deficits and impairments:  Pain, Postural dysfunction, Improper body mechanics, Difficulty walking, Decreased strength,  Decreased range of motion, Abnormal gait  Visit Diagnosis: Chronic bilateral low back pain, with sciatica presence unspecified - Plan: PT plan of care cert/re-cert  Muscle weakness (generalized) - Plan: PT plan of care cert/re-cert  Difficulty in walking, not elsewhere classified - Plan: PT plan of care cert/re-cert  Radiculopathy, lumbar region - Plan: PT plan of care cert/re-cert  Cervicalgia - Plan: PT plan of care cert/re-cert  Radiculopathy, cervical region - Plan: PT plan of care cert/re-cert       G-Codes - 05-18-16 1329    Functional Assessment Tool Used (Outpatient Only) Modified Oswestry Low Back Pain Disability Questionnaire, Quick Dash Disability Symptom Score, clinical presentation, patient interview   Functional Limitation Mobility: Walking and moving around   Mobility: Walking and Moving Around Current Status (T0626) At least 80 percent but less than 100 percent impaired, limited or restricted   Mobility: Walking and Moving Around Goal Status (R4854) At least 40 percent but less than 60 percent impaired, limited or restricted      Problem List Patient Active Problem List   Diagnosis Date Noted  . Chest pain 04/08/2016   Thank you for your referral.  Joneen Boers PT, DPT  2016/05/18, 1:37 PM  [facility] [address], [place], [address] Phone: [phone]   Fax:  [phone]  Name: Sydney Azure MRN: [id number] Date of Birth: 12-05-1970

## 2016-05-12 ENCOUNTER — Ambulatory Visit: Payer: Medicare Other

## 2016-05-12 DIAGNOSIS — M5416 Radiculopathy, lumbar region: Secondary | ICD-10-CM

## 2016-05-12 DIAGNOSIS — M6281 Muscle weakness (generalized): Secondary | ICD-10-CM

## 2016-05-12 DIAGNOSIS — R262 Difficulty in walking, not elsewhere classified: Secondary | ICD-10-CM

## 2016-05-12 DIAGNOSIS — G8929 Other chronic pain: Secondary | ICD-10-CM

## 2016-05-12 DIAGNOSIS — M545 Low back pain: Secondary | ICD-10-CM | POA: Diagnosis not present

## 2016-05-12 NOTE — Patient Instructions (Signed)
  Gave seated gentle hip extension isometrics with slight trunk flexion 5x3 with 5 seconds each side daily as part of his HEP. Pt demonstrated and verbalized understanding.
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
## 2016-05-16 ENCOUNTER — Telehealth: Payer: Self-pay

## 2016-05-16 NOTE — Telephone Encounter (Signed)
No show. Left a message at his home phone number pertaining to his current appointment and reminder for his follow up sessions: 05/31/16 at 4:30 pm; 06/07/16 at 9 am; 06/14/16 at 9:45 am; and 06/16/16 at 10:30 am. Return phone call requested. Phone number [phone]) provided.

## 2016-05-22 ENCOUNTER — Emergency Department: Payer: Medicare Other

## 2016-05-22 ENCOUNTER — Emergency Department
Admission: EM | Admit: 2016-05-22 | Discharge: 2016-05-23 | Disposition: A | Discharge status: Home / Self Care | Payer: Medicare Other | Attending: Emergency Medicine | Admitting: Emergency Medicine

## 2016-05-22 DIAGNOSIS — K5733 Diverticulitis of large intestine without perforation or abscess with bleeding: Secondary | ICD-10-CM | POA: Diagnosis not present

## 2016-05-22 DIAGNOSIS — I10 Essential (primary) hypertension: Secondary | ICD-10-CM | POA: Diagnosis not present

## 2016-05-22 DIAGNOSIS — Z79899 Other long term (current) drug therapy: Secondary | ICD-10-CM | POA: Diagnosis not present

## 2016-05-22 DIAGNOSIS — E119 Type 2 diabetes mellitus without complications: Secondary | ICD-10-CM | POA: Insufficient documentation

## 2016-05-22 DIAGNOSIS — F17200 Nicotine dependence, unspecified, uncomplicated: Secondary | ICD-10-CM | POA: Diagnosis not present

## 2016-05-22 DIAGNOSIS — R1030 Lower abdominal pain, unspecified: Secondary | ICD-10-CM

## 2016-05-22 DIAGNOSIS — K5732 Diverticulitis of large intestine without perforation or abscess without bleeding: Secondary | ICD-10-CM

## 2016-05-22 DIAGNOSIS — Z7984 Long term (current) use of oral hypoglycemic drugs: Secondary | ICD-10-CM | POA: Diagnosis not present

## 2016-05-22 DIAGNOSIS — K625 Hemorrhage of anus and rectum: Secondary | ICD-10-CM | POA: Diagnosis present

## 2016-05-22 LAB — URINALYSIS, ROUTINE W REFLEX MICROSCOPIC
Bacteria, UA: NONE SEEN
Bilirubin Urine: NEGATIVE
GLUCOSE, UA: NEGATIVE mg/dL
Ketones, ur: NEGATIVE mg/dL
Leukocytes, UA: NEGATIVE
Nitrite: NEGATIVE
PROTEIN: NEGATIVE mg/dL
RBC / HPF: NONE SEEN RBC/hpf (ref 0–5)
Specific Gravity, Urine: 1.024 (ref 1.005–1.030)
Squamous Epithelial / LPF: NONE SEEN
pH: 6 (ref 5.0–8.0)

## 2016-05-22 LAB — COMPREHENSIVE METABOLIC PANEL
ALK PHOS: 107 U/L (ref 38–126)
ALT: 39 U/L (ref 17–63)
AST: 31 U/L (ref 15–41)
Albumin: 4.3 g/dL (ref 3.5–5.0)
Anion gap: 10 (ref 5–15)
BUN: 12 mg/dL (ref 6–20)
CALCIUM: 9.4 mg/dL (ref 8.9–10.3)
CO2: 22 mmol/L (ref 22–32)
CREATININE: 0.99 mg/dL (ref 0.61–1.24)
Chloride: 102 mmol/L (ref 101–111)
GFR calc non Af Amer: >60 mL/min (ref >60)
Glucose, Bld: 117 mg/dL — ABNORMAL HIGH (ref 65–99)
Potassium: 4 mmol/L (ref 3.5–5.1)
SODIUM: 134 mmol/L — AB (ref 135–145)
Total Bilirubin: 0.7 mg/dL (ref 0.3–1.2)
Total Protein: 8.1 g/dL (ref 6.5–8.1)

## 2016-05-22 LAB — CBC
HEMATOCRIT: 42.6 % (ref 40.0–52.0)
HEMOGLOBIN: 14.1 g/dL (ref 13.0–18.0)
MCH: 29.3 pg (ref 26.0–34.0)
MCHC: 33 g/dL (ref 32.0–36.0)
MCV: 88.7 fL (ref 80.0–100.0)
Platelets: 326 10*3/uL (ref 150–440)
RBC: 4.81 MIL/uL (ref 4.40–5.90)
RDW: 15.2 % — ABNORMAL HIGH (ref 11.5–14.5)
WBC: 11.1 10*3/uL — ABNORMAL HIGH (ref 3.8–10.6)

## 2016-05-22 LAB — LIPASE, BLOOD: Lipase: 32 U/L (ref 11–51)

## 2016-05-22 MED ORDER — IOPAMIDOL (ISOVUE-300) INJECTION 61%
30.0000 mL | Freq: Once | INTRAVENOUS | Status: AC | PRN
  Administered 2016-05-22: 30 mL via ORAL

## 2016-05-22 MED ORDER — ONDANSETRON HCL 4 MG/2ML IJ SOLN
4.0000 mg | Freq: Once | INTRAMUSCULAR | Status: AC
Start: 2016-05-22 — End: 2016-05-22
  Administered 2016-05-22: 4 mg via INTRAVENOUS
  Filled 2016-05-22: qty 2

## 2016-05-22 MED ORDER — IOPAMIDOL (ISOVUE-300) INJECTION 61%
100.0000 mL | Freq: Once | INTRAVENOUS | Status: AC | PRN
  Administered 2016-05-22: 100 mL via INTRAVENOUS

## 2016-05-22 MED ORDER — HYDROCODONE-ACETAMINOPHEN 5-325 MG PO TABS: 1.0000 | ORAL_TABLET | ORAL | 0 refills | Status: DC | PRN

## 2016-05-22 MED ORDER — ONDANSETRON 4 MG PO TBDP: 4.0000 mg | ORAL_TABLET | Freq: Three times a day (TID) | ORAL | 0 refills | Status: DC | PRN

## 2016-05-22 MED ORDER — SODIUM CHLORIDE 0.9 % IV BOLUS (SEPSIS)
1000.0000 mL | Freq: Once | INTRAVENOUS | Status: AC
  Administered 2016-05-22: 1000 mL via INTRAVENOUS

## 2016-05-22 MED ORDER — HYDROCODONE-ACETAMINOPHEN 5-325 MG PO TABS
2.0000 | ORAL_TABLET | Freq: Once | ORAL | Status: AC
  Administered 2016-05-22: 2 via ORAL
  Filled 2016-05-22: qty 2

## 2016-05-22 MED ORDER — CIPROFLOXACIN HCL 500 MG PO TABS: 500.0000 mg | ORAL_TABLET | Freq: Two times a day (BID) | ORAL | 0 refills | Status: AC

## 2016-05-22 MED ORDER — METRONIDAZOLE 500 MG PO TABS
500.0000 mg | ORAL_TABLET | Freq: Once | ORAL | Status: AC
  Administered 2016-05-22: 500 mg via ORAL
  Filled 2016-05-22: qty 1

## 2016-05-22 MED ORDER — MORPHINE SULFATE (PF) 4 MG/ML IV SOLN
4.0000 mg | Freq: Once | INTRAVENOUS | Status: AC
Start: 2016-05-22 — End: 2016-05-22
  Administered 2016-05-22: 4 mg via INTRAVENOUS
  Filled 2016-05-22: qty 1

## 2016-05-22 MED ORDER — CIPROFLOXACIN HCL 500 MG PO TABS
500.0000 mg | ORAL_TABLET | Freq: Once | ORAL | Status: AC
  Administered 2016-05-22: 500 mg via ORAL
  Filled 2016-05-22: qty 1

## 2016-05-22 MED ORDER — METRONIDAZOLE 500 MG PO TABS: 500.0000 mg | ORAL_TABLET | Freq: Three times a day (TID) | ORAL | 0 refills | Status: AC

## 2016-05-22 NOTE — ED Notes (Signed)
Pt. Going home with family. 

## 2016-05-22 NOTE — ED Provider Notes (Signed)
[facility] Emergency Department Provider Note  Time seen: 7:58 PM  I have reviewed the triage vital signs and the nursing notes.   HISTORY  Chief Complaint Rectal Bleeding    HPI [name] is a 46 y.o. male with a pastmedical history of chronic back and neck pain, depression, diabetes, hypertension, presents the emergency department for abdominal pain difficulty urinating and intermittent rectal bleeding. According to the patient for many years now he has been experiencing intermittent lower abdominal pain which comes in "spasms" along with difficulty urinating and occasionally with blood per rectum when he attempts to have a bowel movement. Patient has been seen by GI medicine in the past has had multiple colonoscopies per wife. Patient has no official diagnosis. Patient has an appointment with his GI doctor on Thursday for another colonoscopy but states the pain became too severe so he came to the emergency department for evaluation. Patient describes the pain as moderate to severe located in the lower abdomen mostly in the right lower quadrant. States some nausea, denies constipation. States he has bowel movements there are just very small and lives with occasional blood in the toilet. Patient also states he is able to urinate but states he urinates small amounts frequently throughout the day. Denies dysuria. Denies fever.  Past Medical History:  Diagnosis Date  . Anxiety   . Chronic back pain   . Chronic neck pain   . Depression   . Diabetes mellitus without complication ([place])   . Enlarged heart    pt report  . Enlarged kidney Left  . GERD (gastroesophageal reflux disease)   . Headache, migraine   . Hypertension     Patient Active Problem List   Diagnosis Date Noted  . Chest pain 04/08/2016    No past surgical history on file.  Prior to Admission medications   Medication Sig Start Date End Date Taking? Authorizing Provider   Butalbital-APAP-Caffeine (FIORICET PO) Take 1 tablet by mouth daily.     Historical Provider, MD  gabapentin (NEURONTIN) 300 MG capsule Take 300 mg by mouth at bedtime. 02/29/16   Historical Provider, MD  lisinopril (PRINIVIL,ZESTRIL) 40 MG tablet Take 40 mg by mouth daily.    Historical Provider, MD  metFORMIN (GLUCOPHAGE) 500 MG tablet Take by mouth 2 (two) times daily with a meal.    Historical Provider, MD  metoprolol tartrate (LOPRESSOR) 25 MG tablet Take 25 mg by mouth 2 (two) times daily.    Historical Provider, MD  Pantoprazole Sodium (PROTONIX PO) Take by mouth.    Historical Provider, MD  sertraline (ZOLOFT) 50 MG tablet Take 50 mg by mouth daily.    Historical Provider, MD  SUMAtriptan (IMITREX) 100 MG tablet Take 100 mg by mouth. May repeat in 2 hours if headache persists or recurs.    Historical Provider, MD    No Known Allergies  No family history on file.  Social History Social History  Substance Use Topics  . Smoking status: Current Every Day Smoker  . Smokeless tobacco: Current User  . Alcohol use Yes     Comment: rare    Review of Systems Constitutional: Negative for fever. Cardiovascular: Negative for chest pain. Respiratory: Negative for shortness of breath. Gastrointestinal: Lower abdominal pain. Positive for nausea. Occasional blood per rectum. Genitourinary: Negative for dysuria. Decreased urination amount with increased frequency. Neurological: Negative for headache 10-point ROS otherwise negative.  ____________________________________________   PHYSICAL EXAM:  VITAL SIGNS: ED Triage Vitals [05/22/16 1934]  Enc Vitals  Group     BP (!) 130/93     Pulse Rate 98     Resp 20     Temp 98.6 F (37 C)     Temp Source Oral     SpO2 99 %     Weight 184 lb (83.5 kg)     Height 5\' 8"  (1.727 m)     Head Circumference      Peak Flow      Pain Score      Pain Loc      Pain Edu?      Excl. in [place]?     Constitutional: Alert and oriented. Well appearing  and in no distress. Eyes: Normal exam ENT   Head: Normocephalic and atraumatic.   Mouth/Throat: Mucous membranes are moist. Cardiovascular: Normal rate, regular rhythm. No murmur Respiratory: Normal respiratory effort without tachypnea nor retractions. Breath sounds are clear  Gastrointestinal: Soft, moderate suprapubic and right lower quadrant tenderness palpation. No rebound or guarding. No distention. Musculoskeletal: Nontender with normal range of motion in all extremities. Neurologic:  Normal speech and language. No gross focal neurologic deficits  Skin:  Skin is warm, dry and intact.  Psychiatric: Mood and affect are normal. Speech and behavior are normal.   ____________________________________________   RADIOLOGY  CT consistent with acute diverticulitis.  ____________________________________________   INITIAL IMPRESSION / ASSESSMENT AND PLAN / ED COURSE  Pertinent labs & imaging results that were available during my care of the patient were reviewed by me and considered in my medical decision making (see chart for details).  Patient presents to the emergency department with abdominal pain. Patient states this is identical to the pain he has experienced many times over the past 10+ years. He states no one has been able to tell him what causes the pain. Patient also states occasional blood in the toilet when he attempts to have a bowel movement. We will check labs, treat pain and nausea medication will perform a rectal examination. Given the patient's moderate right lower quadrant tenderness to palpation anticipate likely CT scan to rule out acute intra-abdominal pathology.  Labs are largely nonrevealing. CT consistent with acute sigmoid diverticulitis. We will place the patient on antibiotics, Percocet and Zofran to be used as needed. I discussed with the patient the need to follow-up with his GI doctor and call them tomorrow to let them know this diagnosis as they may want  to delay the patient's colonoscopy. They are agreeable to this plan. I also provided my normal diverticulitis return precautions for any increased pain or fever.  ____________________________________________   FINAL CLINICAL IMPRESSION(S) / ED DIAGNOSES  Lower abdominal pain Sigmoid diverticulitis   Harvest Dark, MD 05/22/16 2325

## 2016-05-22 NOTE — ED Notes (Signed)
Pt. States he has been constipated and unable to urinate today.  Pt. States blood in stools for the past 2 days.

## 2016-05-22 NOTE — ED Notes (Signed)
Ct called and made aware that pt is done with contrast

## 2016-05-22 NOTE — Discharge Instructions (Signed)
Please call your GI doctor tomorrow to inform them of today's ER visit and your diagnosis of diverticulitis. Please take your antibiotics as prescribed for their entire duration. Return to the emergency department for any sudden increase in abdominal pain or if you develop a fever.

## 2016-05-22 NOTE — ED Notes (Signed)
Bladder scan showed 138 ml

## 2016-05-22 NOTE — ED Triage Notes (Signed)
Pt reports bowel issues for the past 2 months, colonoscopy scheduled for this Thursday, pt states that his last BM was yesterday and unable to go today and states that he has passed blood 4 times without bearing down hard, pt also reports that he is having difficulty passing urine

## 2016-05-26 SURGERY — COLONOSCOPY WITH PROPOFOL
Anesthesia: General

## 2016-05-31 ENCOUNTER — Telehealth: Payer: Self-pay

## 2016-05-31 ENCOUNTER — Ambulatory Visit: Payer: Medicare Other

## 2016-05-31 NOTE — Telephone Encounter (Signed)
No show. Unable to contact pt. Unable to leave a voice mail message to home phone number secondary to memory being full. Unable to leave a message to his mobile phone number secondary to voice mailbox not set up.

## 2016-06-07 ENCOUNTER — Ambulatory Visit: Payer: Medicare Other | Attending: Physical Medicine and Rehabilitation

## 2016-06-07 DIAGNOSIS — M5416 Radiculopathy, lumbar region: Secondary | ICD-10-CM | POA: Insufficient documentation

## 2016-06-07 DIAGNOSIS — G8929 Other chronic pain: Secondary | ICD-10-CM

## 2016-06-07 DIAGNOSIS — M542 Cervicalgia: Secondary | ICD-10-CM | POA: Diagnosis present

## 2016-06-07 DIAGNOSIS — M545 Low back pain: Secondary | ICD-10-CM | POA: Insufficient documentation

## 2016-06-07 DIAGNOSIS — M5412 Radiculopathy, cervical region: Secondary | ICD-10-CM | POA: Diagnosis present

## 2016-06-07 DIAGNOSIS — M6281 Muscle weakness (generalized): Secondary | ICD-10-CM | POA: Insufficient documentation

## 2016-06-07 DIAGNOSIS — R262 Difficulty in walking, not elsewhere classified: Secondary | ICD-10-CM | POA: Insufficient documentation
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
## 2016-06-14 ENCOUNTER — Ambulatory Visit: Payer: Medicare Other

## 2016-06-14 DIAGNOSIS — R262 Difficulty in walking, not elsewhere classified: Secondary | ICD-10-CM

## 2016-06-14 DIAGNOSIS — M542 Cervicalgia: Secondary | ICD-10-CM

## 2016-06-14 DIAGNOSIS — M545 Low back pain: Secondary | ICD-10-CM | POA: Diagnosis not present

## 2016-06-14 DIAGNOSIS — M5412 Radiculopathy, cervical region: Secondary | ICD-10-CM
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
## 2016-06-14 NOTE — Patient Instructions (Signed)
Pt was recommended to press his tongue gently at the roof of his mouth as part of his HEP. Pt demonstrated and verbalized understanding.

## 2016-06-16 ENCOUNTER — Ambulatory Visit: Payer: Medicare Other

## 2016-06-16 DIAGNOSIS — R262 Difficulty in walking, not elsewhere classified: Secondary | ICD-10-CM

## 2016-06-16 DIAGNOSIS — G8929 Other chronic pain: Secondary | ICD-10-CM

## 2016-06-16 DIAGNOSIS — M542 Cervicalgia: Secondary | ICD-10-CM

## 2016-06-16 DIAGNOSIS — M6281 Muscle weakness (generalized): Secondary | ICD-10-CM

## 2016-06-16 DIAGNOSIS — M5412 Radiculopathy, cervical region: Secondary | ICD-10-CM

## 2016-06-16 DIAGNOSIS — M545 Low back pain: Secondary | ICD-10-CM | POA: Diagnosis not present

## 2016-06-16 DIAGNOSIS — M5416 Radiculopathy, lumbar region: Secondary | ICD-10-CM
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
## 2016-06-16 NOTE — Patient Instructions (Addendum)
  PELVIC TILT: Posterior    Gently flatten low back, then tilt forward to gently extend low back (rock your pelvis back and forth)    _10__ reps per set, __3_ sets per day   Copyright  VHI. All rights reserved.        Sitting on a chair:    Gently sit up straight, then rest    Perform 10 times for 3 sets daily    Always use a comfortable lumbar towel roll or back support whenever you are sitting such as at your car or couch to help you maintain the upright position.

## 2016-06-29 ENCOUNTER — Ambulatory Visit: Payer: Medicare Other | Attending: Neurology

## 2016-06-29 DIAGNOSIS — I119 Hypertensive heart disease without heart failure: Secondary | ICD-10-CM | POA: Diagnosis not present

## 2016-06-29 DIAGNOSIS — N289 Disorder of kidney and ureter, unspecified: Secondary | ICD-10-CM | POA: Diagnosis not present

## 2016-06-29 DIAGNOSIS — R413 Other amnesia: Secondary | ICD-10-CM | POA: Diagnosis not present

## 2016-06-29 DIAGNOSIS — E119 Type 2 diabetes mellitus without complications: Secondary | ICD-10-CM | POA: Diagnosis not present

## 2016-06-29 DIAGNOSIS — Z885 Allergy status to narcotic agent status: Secondary | ICD-10-CM | POA: Insufficient documentation

## 2016-06-29 DIAGNOSIS — Z7984 Long term (current) use of oral hypoglycemic drugs: Secondary | ICD-10-CM | POA: Insufficient documentation

## 2016-06-29 DIAGNOSIS — G4733 Obstructive sleep apnea (adult) (pediatric): Secondary | ICD-10-CM | POA: Diagnosis not present

## 2016-06-29 DIAGNOSIS — Z79899 Other long term (current) drug therapy: Secondary | ICD-10-CM | POA: Insufficient documentation

## 2016-07-23 ENCOUNTER — Emergency Department: Payer: Medicare Other

## 2016-07-23 ENCOUNTER — Inpatient Hospital Stay
Admission: EM | Admit: 2016-07-23 | Discharge: 2016-07-25 | DRG: 872 | Disposition: A | Discharge status: Home / Self Care | Payer: Medicare Other | Attending: Internal Medicine | Admitting: Internal Medicine

## 2016-07-23 ENCOUNTER — Encounter: Payer: Self-pay | Admitting: Emergency Medicine

## 2016-07-23 DIAGNOSIS — I10 Essential (primary) hypertension: Secondary | ICD-10-CM | POA: Diagnosis present

## 2016-07-23 DIAGNOSIS — K625 Hemorrhage of anus and rectum: Secondary | ICD-10-CM | POA: Diagnosis present

## 2016-07-23 DIAGNOSIS — F419 Anxiety disorder, unspecified: Secondary | ICD-10-CM | POA: Diagnosis present

## 2016-07-23 DIAGNOSIS — Z7984 Long term (current) use of oral hypoglycemic drugs: Secondary | ICD-10-CM

## 2016-07-23 DIAGNOSIS — K219 Gastro-esophageal reflux disease without esophagitis: Secondary | ICD-10-CM | POA: Diagnosis present

## 2016-07-23 DIAGNOSIS — E860 Dehydration: Secondary | ICD-10-CM | POA: Diagnosis present

## 2016-07-23 DIAGNOSIS — G8929 Other chronic pain: Secondary | ICD-10-CM | POA: Diagnosis present

## 2016-07-23 DIAGNOSIS — E876 Hypokalemia: Secondary | ICD-10-CM | POA: Diagnosis present

## 2016-07-23 DIAGNOSIS — E119 Type 2 diabetes mellitus without complications: Secondary | ICD-10-CM

## 2016-07-23 DIAGNOSIS — M542 Cervicalgia: Secondary | ICD-10-CM | POA: Diagnosis present

## 2016-07-23 DIAGNOSIS — A045 Campylobacter enteritis: Secondary | ICD-10-CM | POA: Diagnosis present

## 2016-07-23 DIAGNOSIS — M549 Dorsalgia, unspecified: Secondary | ICD-10-CM | POA: Diagnosis present

## 2016-07-23 DIAGNOSIS — N179 Acute kidney failure, unspecified: Secondary | ICD-10-CM | POA: Diagnosis present

## 2016-07-23 DIAGNOSIS — R197 Diarrhea, unspecified: Secondary | ICD-10-CM | POA: Diagnosis present

## 2016-07-23 DIAGNOSIS — Z833 Family history of diabetes mellitus: Secondary | ICD-10-CM

## 2016-07-23 DIAGNOSIS — Z8249 Family history of ischemic heart disease and other diseases of the circulatory system: Secondary | ICD-10-CM

## 2016-07-23 DIAGNOSIS — A419 Sepsis, unspecified organism: Secondary | ICD-10-CM | POA: Diagnosis present

## 2016-07-23 DIAGNOSIS — F17290 Nicotine dependence, other tobacco product, uncomplicated: Secondary | ICD-10-CM | POA: Diagnosis present

## 2016-07-23 LAB — COMPREHENSIVE METABOLIC PANEL
ALBUMIN: 4.1 g/dL (ref 3.5–5.0)
ALT: 57 U/L (ref 17–63)
ANION GAP: 7 (ref 5–15)
AST: 37 U/L (ref 15–41)
Alkaline Phosphatase: 125 U/L (ref 38–126)
BILIRUBIN TOTAL: 0.9 mg/dL (ref 0.3–1.2)
BUN: 38 mg/dL — AB (ref 6–20)
CO2: 19 mmol/L — AB (ref 22–32)
Calcium: 9.2 mg/dL (ref 8.9–10.3)
Chloride: 110 mmol/L (ref 101–111)
Creatinine, Ser: 1.84 mg/dL — ABNORMAL HIGH (ref 0.61–1.24)
GFR calc Af Amer: 49 mL/min — ABNORMAL LOW (ref >60)
GFR calc non Af Amer: 42 mL/min — ABNORMAL LOW (ref >60)
GLUCOSE: 92 mg/dL (ref 65–99)
Potassium: 3.5 mmol/L (ref 3.5–5.1)
SODIUM: 136 mmol/L (ref 135–145)
Total Protein: 8.3 g/dL — ABNORMAL HIGH (ref 6.5–8.1)

## 2016-07-23 LAB — CBC
HEMATOCRIT: 40.7 % (ref 40.0–52.0)
HEMOGLOBIN: 13.5 g/dL (ref 13.0–18.0)
MCH: 29.1 pg (ref 26.0–34.0)
MCHC: 33.3 g/dL (ref 32.0–36.0)
MCV: 87.4 fL (ref 80.0–100.0)
Platelets: 252 10*3/uL (ref 150–440)
RBC: 4.65 MIL/uL (ref 4.40–5.90)
RDW: 15.1 % — AB (ref 11.5–14.5)
WBC: 9 10*3/uL (ref 3.8–10.6)

## 2016-07-23 LAB — DIFFERENTIAL
BASOS ABS: 0 10*3/uL (ref 0–0.1)
BASOS PCT: 0 %
EOS ABS: 0.1 10*3/uL (ref 0–0.7)
Eosinophils Relative: 1 %
Lymphocytes Relative: 16 %
Lymphs Abs: 1.5 10*3/uL (ref 1.0–3.6)
MONOS PCT: 1 %
Monocytes Absolute: 0.1 10*3/uL — ABNORMAL LOW (ref 0.2–1.0)
NEUTROS PCT: 82 %
Neutro Abs: 7.5 10*3/uL — ABNORMAL HIGH (ref 1.4–6.5)

## 2016-07-23 LAB — LACTIC ACID, PLASMA
LACTIC ACID, VENOUS: 0.4 mmol/L — AB (ref 0.5–1.9)
Lactic Acid, Venous: 1.8 mmol/L (ref 0.5–1.9)

## 2016-07-23 LAB — TYPE AND SCREEN
ABO/RH(D): O POS
Antibody Screen: NEGATIVE

## 2016-07-23 LAB — TROPONIN I

## 2016-07-23 LAB — CK: CK TOTAL: 123 U/L (ref 49–397)

## 2016-07-23 MED ORDER — ACETAMINOPHEN 325 MG PO TABS: 650.0000 mg | ORAL_TABLET | Freq: Four times a day (QID) | ORAL | Status: DC | PRN

## 2016-07-23 MED ORDER — SODIUM CHLORIDE 0.9 % IV BOLUS (SEPSIS)
500.0000 mL | Freq: Once | INTRAVENOUS | Status: AC
  Administered 2016-07-23: 500 mL via INTRAVENOUS

## 2016-07-23 MED ORDER — ONDANSETRON HCL 4 MG/2ML IJ SOLN: 4.0000 mg | Freq: Four times a day (QID) | INTRAMUSCULAR | Status: DC | PRN

## 2016-07-23 MED ORDER — LORAZEPAM 2 MG/ML IJ SOLN
0.5000 mg | Freq: Once | INTRAMUSCULAR | Status: AC
  Administered 2016-07-23: 0.5 mg via INTRAVENOUS
  Filled 2016-07-23: qty 1

## 2016-07-23 MED ORDER — DOXYCYCLINE HYCLATE 100 MG PO TABS
100.0000 mg | ORAL_TABLET | Freq: Two times a day (BID) | ORAL | Status: DC
Start: 2016-07-23 — End: 2016-07-24
  Filled 2016-07-23: qty 1

## 2016-07-23 MED ORDER — SODIUM CHLORIDE 0.9 % IV SOLN
INTRAVENOUS | Status: DC
  Administered 2016-07-24 (×2): via INTRAVENOUS

## 2016-07-23 MED ORDER — INSULIN ASPART 100 UNIT/ML ~~LOC~~ SOLN
0.0000 [IU] | Freq: Four times a day (QID) | SUBCUTANEOUS | Status: DC
  Administered 2016-07-24: 01:00:00 1 [IU] via SUBCUTANEOUS
  Filled 2016-07-23: qty 1

## 2016-07-23 MED ORDER — NORTRIPTYLINE HCL 25 MG PO CAPS
50.0000 mg | ORAL_CAPSULE | Freq: Every day | ORAL | Status: DC
  Administered 2016-07-24 (×2): 50 mg via ORAL
  Filled 2016-07-23 (×2): qty 2

## 2016-07-23 MED ORDER — IOPAMIDOL (ISOVUE-300) INJECTION 61%
80.0000 mL | Freq: Once | INTRAVENOUS | Status: AC | PRN
  Administered 2016-07-23: 80 mL via INTRAVENOUS

## 2016-07-23 MED ORDER — ONDANSETRON HCL 4 MG PO TABS: 4.0000 mg | ORAL_TABLET | Freq: Four times a day (QID) | ORAL | Status: DC | PRN

## 2016-07-23 MED ORDER — PANTOPRAZOLE SODIUM 40 MG PO TBEC
40.0000 mg | DELAYED_RELEASE_TABLET | Freq: Every day | ORAL | Status: DC
  Administered 2016-07-24 – 2016-07-25 (×2): 40 mg via ORAL
  Filled 2016-07-23 (×2): qty 1

## 2016-07-23 MED ORDER — SODIUM CHLORIDE 0.9 % IV BOLUS (SEPSIS)
1000.0000 mL | Freq: Once | INTRAVENOUS | Status: AC
  Administered 2016-07-23: 1000 mL via INTRAVENOUS

## 2016-07-23 MED ORDER — PROMETHAZINE HCL 25 MG/ML IJ SOLN
12.5000 mg | Freq: Once | INTRAMUSCULAR | Status: AC
Start: 2016-07-23 — End: 2016-07-23
  Administered 2016-07-23: 12.5 mg via INTRAVENOUS
  Filled 2016-07-23: qty 1

## 2016-07-23 MED ORDER — ACETAMINOPHEN 650 MG RE SUPP: 650.0000 mg | Freq: Four times a day (QID) | RECTAL | Status: DC | PRN

## 2016-07-23 MED ORDER — PIPERACILLIN-TAZOBACTAM 3.375 G IVPB 30 MIN
3.3750 g | Freq: Once | INTRAVENOUS | Status: AC
  Administered 2016-07-23: 3.375 g via INTRAVENOUS
  Filled 2016-07-23: qty 50

## 2016-07-23 MED ORDER — PIPERACILLIN-TAZOBACTAM 3.375 G IVPB
3.3750 g | Freq: Three times a day (TID) | INTRAVENOUS | Status: DC
  Filled 2016-07-23 (×2): qty 50

## 2016-07-23 MED ORDER — METOPROLOL TARTRATE 25 MG PO TABS
25.0000 mg | ORAL_TABLET | Freq: Two times a day (BID) | ORAL | Status: DC
Start: 2016-07-23 — End: 2016-07-25
  Administered 2016-07-24 – 2016-07-25 (×4): 25 mg via ORAL
  Filled 2016-07-23 (×4): qty 1

## 2016-07-23 NOTE — ED Notes (Signed)
Patient transported to CT 

## 2016-07-23 NOTE — ED Provider Notes (Signed)
[facility] Emergency Department Provider Note    None    (approximate)  I have reviewed the triage vital signs and the nursing notes.   HISTORY  Chief Complaint Diarrhea   HPI [name] Vital is a 46 y.o. male presents to the ER today due to chief complaint of multiple episodes of stools over the past week area had one episode of bloody diarrhea.  Patient just returned from [place] where he was visiting family this past Thursday. Patient with Reiger's and chief complaint of dizziness and generalized myalgias. States he's having countless episodes of loose stool. States he was on malaria prophylaxis. Has had multiple mosquito bites.  He denies any chest pain at this time. States he feels very anxious. She has a history of anxiety.   Past Medical History:  Diagnosis Date  . Anxiety   . Chronic back pain   . Chronic neck pain   . Depression   . Diabetes mellitus without complication ([place])   . Enlarged heart    pt report  . Enlarged kidney Left  . GERD (gastroesophageal reflux disease)   . Headache, migraine   . Hypertension    No family history on file. History reviewed. No pertinent surgical history. Patient Active Problem List   Diagnosis Date Noted  . Chest pain 04/08/2016      Prior to Admission medications   Medication Sig Start Date End Date Taking? Authorizing Provider  Butalbital-APAP-Caffeine (FIORICET PO) Take 1 tablet by mouth daily.     [provider]  gabapentin (NEURONTIN) 300 MG capsule Take 300 mg by mouth at bedtime. 02/29/16   [provider]  HYDROcodone-acetaminophen (NORCO/VICODIN) 5-325 MG tablet Take 1 tablet by mouth every 4 (four) hours as needed. 05/22/16   Harvest Dark, MD  lisinopril (PRINIVIL,ZESTRIL) 40 MG tablet Take 40 mg by mouth daily.    [provider]  metFORMIN (GLUCOPHAGE) 500 MG tablet Take by mouth 2 (two) times daily with a meal.    [provider]  metoprolol  tartrate (LOPRESSOR) 25 MG tablet Take 25 mg by mouth 2 (two) times daily.    [provider]  ondansetron (ZOFRAN ODT) 4 MG disintegrating tablet Take 1 tablet (4 mg total) by mouth every 8 (eight) hours as needed for nausea or vomiting. 05/22/16   Harvest Dark, MD  Pantoprazole Sodium (PROTONIX PO) Take by mouth.    [provider]  sertraline (ZOLOFT) 50 MG tablet Take 50 mg by mouth daily.    [provider]  SUMAtriptan (IMITREX) 100 MG tablet Take 100 mg by mouth. May repeat in 2 hours if headache persists or recurs.    [provider]    Allergies Patient has no known allergies.    Social History Social History  Substance Use Topics  . Smoking status: Former Research scientist (life sciences)  . Smokeless tobacco: Current User  . Alcohol use Yes     Comment: rare    Review of Systems Patient denies headaches, rhinorrhea, blurry vision, numbness, shortness of breath, chest pain, edema, cough, abdominal pain, nausea, vomiting, diarrhea, dysuria, fevers, rashes or hallucinations unless otherwise stated above in HPI. ____________________________________________   PHYSICAL EXAM:  VITAL SIGNS: Vitals:   07/23/16 1910  BP: 129/81  Pulse: (!) 108  Resp: 18  Temp: 99.6 F (37.6 C)    Constitutional: Alert and oriented. Ill appearing but in no acute distress. Eyes: Conjunctivae are normal.  Head: Atraumatic. Nose: No congestion/rhinnorhea. Mouth/Throat: Mucous membranes are moist.  Neck: No stridor. Painless ROM.  Cardiovascular: tachycardic rate, regular rhythm. Grossly normal heart sounds.  Good peripheral circulation. Respiratory: Normal respiratory effort.  No retractions. Lungs CTAB. Gastrointestinal: Soft and nontender. No distention. No abdominal bruits. No CVA tenderness. Musculoskeletal: No lower extremity tenderness nor edema.  No joint effusions. Neurologic:  Normal speech and language. No gross focal neurologic deficits are appreciated. No facial  droop Skin:  Skin is warm, dry and intact. No rash noted. Psychiatric: anxious appearing.  ____________________________________________   LABS (all labs ordered are listed, but only abnormal results are displayed)  Results for orders placed or performed during the hospital encounter of 07/23/16 (from the past 24 hour(s))  Comprehensive metabolic panel     Status: Abnormal   Collection Time: 07/23/16  7:26 PM  Result Value Ref Range   Sodium 136 135 - 145 mmol/L   Potassium 3.5 3.5 - 5.1 mmol/L   Chloride 110 101 - 111 mmol/L   CO2 19 (L) 22 - 32 mmol/L   Glucose, Bld 92 65 - 99 mg/dL   BUN 38 (H) 6 - 20 mg/dL   Creatinine, Ser 1.84 (H) 0.61 - 1.24 mg/dL   Calcium 9.2 8.9 - 10.3 mg/dL   Total Protein 8.3 (H) 6.5 - 8.1 g/dL   Albumin 4.1 3.5 - 5.0 g/dL   AST 37 15 - 41 U/L   ALT 57 17 - 63 U/L   Alkaline Phosphatase 125 38 - 126 U/L   Total Bilirubin 0.9 0.3 - 1.2 mg/dL   GFR calc non Af Amer 42 (L) >60 mL/min   GFR calc Af Amer 49 (L) >60 mL/min   Anion gap 7 5 - 15  CBC     Status: Abnormal   Collection Time: 07/23/16  7:26 PM  Result Value Ref Range   WBC 9.0 3.8 - 10.6 K/uL   RBC 4.65 4.40 - 5.90 MIL/uL   Hemoglobin 13.5 13.0 - 18.0 g/dL   HCT 40.7 40.0 - 52.0 %   MCV 87.4 80.0 - 100.0 fL   MCH 29.1 26.0 - 34.0 pg   MCHC 33.3 32.0 - 36.0 g/dL   RDW 15.1 (H) 11.5 - 14.5 %   Platelets 252 150 - 440 K/uL  Type and screen [facility]     Status: None   Collection Time: 07/23/16  7:26 PM  Result Value Ref Range   ABO/RH(D) O POS    Antibody Screen NEG    Sample Expiration 07/26/2016   Troponin I     Status: None   Collection Time: 07/23/16  7:26 PM  Result Value Ref Range   Troponin I <0.03 <0.03 ng/mL  CK     Status: None   Collection Time: 07/23/16  7:26 PM  Result Value Ref Range   Total CK 123 49 - 397 U/L  Differential     Status: Abnormal   Collection Time: 07/23/16  7:26 PM  Result Value Ref Range   Neutrophils Relative % 82 %    Neutro Abs 7.5 (H) 1.4 - 6.5 K/uL   Lymphocytes Relative 16 %   Lymphs Abs 1.5 1.0 - 3.6 K/uL   Monocytes Relative 1 %   Monocytes Absolute 0.1 (L) 0.2 - 1.0 K/uL   Eosinophils Relative 1 %   Eosinophils Absolute 0.1 0 - 0.7 K/uL   Basophils Relative 0 %   Basophils Absolute 0.0 0 - 0.1 K/uL  Lactic acid, plasma     Status: None   Collection  Time: 07/23/16  7:45 PM  Result Value Ref Range   Lactic Acid, Venous 1.8 0.5 - 1.9 mmol/L   ____________________________________________  EKG My review and personal interpretation at Time: 19:31   Indication: tachycardia  Rate: 110  Rhythm: sinus Axis: normal Other: inferior t wave inversions with reciprocal changes, no STEMI ____________________________________________  RADIOLOGY  I personally reviewed all radiographic images ordered to evaluate for the above acute complaints and reviewed radiology reports and findings.  These findings were personally discussed with the patient.  Please see medical record for radiology report.  ____________________________________________   PROCEDURES  Procedure(s) performed:  Procedures    Critical Care performed: yes CRITICAL CARE Performed by: Merlyn Lot   Total critical care time: 35 minutes  Critical care time was exclusive of separately billable procedures and treating other patients.  Critical care was necessary to treat or prevent imminent or life-threatening deterioration.  Critical care was time spent personally by me on the following activities: development of treatment plan with patient and/or surrogate as well as nursing, discussions with consultants, evaluation of patient's response to treatment, examination of patient, obtaining history from patient or surrogate, ordering and performing treatments and interventions, ordering and review of laboratory studies, ordering and review of radiographic studies, pulse oximetry and re-evaluation of patient's  condition.  ____________________________________________   INITIAL IMPRESSION / ASSESSMENT AND PLAN / ED COURSE  Pertinent labs & imaging results that were available during my care of the patient were reviewed by me and considered in my medical decision making (see chart for details).  DDX: sepsis, bacteremia, typhoid, yellow fever, malaria, enteritis, diverticulitis  Taimur Fier is a 46 y.o. who presents to the ED with above complaints and multiple episodes of diarrhea. Patient with evidence of sepsis.  Patient is ill-appearing. Abdominal exam with diffuse tenderness but not peritoneal.  Will initiate sepsis workup. The patient will be placed on continuous pulse oximetry and telemetry for monitoring.  Laboratory evaluation will be sent to evaluate for the above complaints.     Clinical Course as of Jul 23 2124  Sat Jul 23, 2016  2024 Patient record temperature of 100.3. Does have evidence of metabolic acidosis and a KI. Continue IV fluids. Will order CT imaging to evaluate for any evidence of diverticulitis as he has had this in the past.  [PR]  2124 CT imaging with diffuse colitis but no evidence of microperforation or abscess. Based on his septic or to do feel patient will require admission for continued IV fluid resuscitation and monitoring.  Have discussed with the patient and available family all diagnostics and treatments performed thus far and all questions were answered to the best of my ability. The patient demonstrates understanding and agreement with plan.   [PR]    Clinical Course User Index [PR] Merlyn Lot, MD     ____________________________________________   FINAL CLINICAL IMPRESSION(S) / ED DIAGNOSES  Final diagnoses:  Sepsis, due to unspecified organism [facility])  Dehydration  Diarrhea of presumed infectious origin      NEW MEDICATIONS STARTED DURING THIS VISIT:  New Prescriptions   No medications on file     Note:  This document was prepared using  Dragon voice recognition software and may include unintentional dictation errors.    Merlyn Lot, MD 07/23/16 2126

## 2016-07-23 NOTE — H&P (Signed)
[place] [facility] [place] NAME: [name]    MR#:  [id number]  DATE OF BIRTH:  [birth year]  DATE OF ADMISSION:  07/23/2016  PRIMARY CARE PHYSICIAN: Alene Mires Elyse Jarvis, MD   REQUESTING/REFERRING PHYSICIAN: Quentin Cornwall, MD  CHIEF COMPLAINT:   Chief Complaint  Patient presents with  . Rectal Bleeding    HISTORY OF PRESENT ILLNESS:  [name]  is a 46 y.o. male who presents with 1 week of diarrhea with intermittent blood in his stool. Patient states that the diarrhea started just before he left a little more than week ago to visit [place].  He felt progressively worse there, and came back to the [place] when his daughter was admitted to [facility]. His diarrhea has been persistent and he has had intermittent blood in it. Patient does have a history of bleeding hemorrhoids. He also has a history of diverticulitis. Hospitalists were called for admission  PAST MEDICAL HISTORY:   Past Medical History:  Diagnosis Date  . Anxiety   . Chronic back pain   . Chronic neck pain   . Depression   . Diabetes mellitus without complication ([place])   . Enlarged heart    pt report  . Enlarged kidney Left  . GERD (gastroesophageal reflux disease)   . Headache, migraine   . Hypertension     PAST SURGICAL HISTORY:  History reviewed. No pertinent surgical history.  SOCIAL HISTORY:   Social History  Substance Use Topics  . Smoking status: Former Research scientist (life sciences)  . Smokeless tobacco: Current User  . Alcohol use Yes     Comment: rare    FAMILY HISTORY:   Family History  Problem Relation Age of Onset  . Diabetes Mother   . Hypertension Mother   . Hypertension Father     DRUG ALLERGIES:  No Known Allergies  MEDICATIONS AT HOME:   Prior to Admission medications   Medication Sig Start Date End Date Taking? Authorizing Provider  Butalbital-APAP-Caffeine (FIORICET PO) Take 1 tablet by mouth daily.    Yes [provider]  hyoscyamine  (LEVSIN SL) 0.125 MG SL tablet Place 0.125 mg under the tongue every 4 (four) hours as needed.   Yes [provider]  lisinopril (PRINIVIL,ZESTRIL) 40 MG tablet Take 40 mg by mouth daily.   Yes [provider]  metFORMIN (GLUCOPHAGE) 500 MG tablet Take by mouth 2 (two) times daily with a meal.   Yes [provider]  metoprolol tartrate (LOPRESSOR) 25 MG tablet Take 25 mg by mouth 2 (two) times daily.   Yes [provider]  nortriptyline (PAMELOR) 50 MG capsule Take 50 mg by mouth at bedtime.   Yes [provider]  pantoprazole (PROTONIX) 40 MG tablet Take 40 mg by mouth daily.   Yes [provider]  HYDROcodone-acetaminophen (NORCO/VICODIN) 5-325 MG tablet Take 1 tablet by mouth every 4 (four) hours as needed. Patient not taking: Reported on 07/23/2016 05/22/16   Harvest Dark, MD  ondansetron (ZOFRAN ODT) 4 MG disintegrating tablet Take 1 tablet (4 mg total) by mouth every 8 (eight) hours as needed for nausea or vomiting. 05/22/16   Harvest Dark, MD    REVIEW OF SYSTEMS:  Review of Systems  Constitutional: Negative for chills, fever, malaise/fatigue and weight loss.  HENT: Negative for ear pain, hearing loss and tinnitus.   Eyes: Negative for blurred vision, double vision, pain and redness.  Respiratory: Negative for cough, hemoptysis and shortness of breath.   Cardiovascular: Negative  for chest pain, palpitations, orthopnea and leg swelling.  Gastrointestinal: Positive for abdominal pain, blood in stool, diarrhea and nausea. Negative for constipation and vomiting.  Genitourinary: Negative for dysuria, frequency and hematuria.  Musculoskeletal: Negative for back pain, joint pain and neck pain.  Skin:       No acne, rash, or lesions  Neurological: Negative for dizziness, tremors, focal weakness and weakness.  Endo/Heme/Allergies: Negative for polydipsia. Does not bruise/bleed easily.  Psychiatric/Behavioral: Negative for  depression. The patient is not nervous/anxious and does not have insomnia.      VITAL SIGNS:   Vitals:   07/23/16 1945 07/23/16 2000 07/23/16 2024 07/23/16 2130  BP:  137/80  120/78  Pulse: (!) 115 98    Resp: (!) 23 (!) 24  (!) 26  Temp:   100.3 F (37.9 C)   TempSrc:   Rectal   SpO2: 99% 100%    Weight:      Height:       Wt Readings from Last 3 Encounters:  07/23/16 77.6 kg (171 lb)  05/22/16 83.5 kg (184 lb)  04/08/16 83.9 kg (185 lb)    PHYSICAL EXAMINATION:  Physical Exam  Vitals reviewed. Constitutional: He is oriented to person, place, and time. He appears well-developed and well-nourished. No distress.  HENT:  Head: Normocephalic and atraumatic.  Mouth/Throat: Oropharynx is clear and moist.  Eyes: Conjunctivae and EOM are normal. Pupils are equal, round, and reactive to light. No scleral icterus.  Neck: Normal range of motion. Neck supple. No JVD present. No thyromegaly present.  Cardiovascular: Regular rhythm and intact distal pulses.  Exam reveals no gallop and no friction rub.   No murmur heard. Mild tachycardia  Respiratory: Effort normal and breath sounds normal. No respiratory distress. He has no wheezes. He has no rales.  GI: Soft. Bowel sounds are normal. He exhibits no distension. There is no tenderness.  Musculoskeletal: Normal range of motion. He exhibits no edema.  No arthritis, no gout  Lymphadenopathy:    He has no cervical adenopathy.  Neurological: He is alert and oriented to person, place, and time. No cranial nerve deficit.  No dysarthria, no aphasia  Skin: Skin is warm and dry. No rash noted. No erythema.  Psychiatric: He has a normal mood and affect. His behavior is normal. Judgment and thought content normal.    LABORATORY PANEL:   CBC  Recent Labs Lab 07/23/16 1926  WBC 9.0  HGB 13.5  HCT 40.7  PLT 252    ------------------------------------------------------------------------------------------------------------------  Chemistries   Recent Labs Lab 07/23/16 1926  NA 136  K 3.5  CL 110  CO2 19*  GLUCOSE 92  BUN 38*  CREATININE 1.84*  CALCIUM 9.2  AST 37  ALT 57  ALKPHOS 125  BILITOT 0.9   ------------------------------------------------------------------------------------------------------------------  Cardiac Enzymes  Recent Labs Lab 07/23/16 1926  TROPONINI <0.03   ------------------------------------------------------------------------------------------------------------------  RADIOLOGY:  Dg Chest 2 View  Result Date: 07/23/2016 CLINICAL DATA:  Weakness and fever EXAM: CHEST  2 VIEW COMPARISON:  04/08/2016 FINDINGS: No focal pulmonary infiltrate, consolidation, or effusion. Stable cardiomediastinal silhouette. No pneumothorax. Chronic deformity of the proximal right humerus. IMPRESSION: No active cardiopulmonary disease. Electronically Signed   By: Donavan Foil M.D.   On: 07/23/2016 21:10   Ct Abdomen Pelvis W Contrast  Result Date: 07/23/2016 CLINICAL DATA:  Frequent bloody stools over the last week. History of hemorrhoids. EXAM: CT ABDOMEN AND PELVIS WITH CONTRAST TECHNIQUE: Multidetector CT imaging of the abdomen and pelvis was performed using  the standard protocol following bolus administration of intravenous contrast. CONTRAST:  39mL ISOVUE-300 IOPAMIDOL (ISOVUE-300) INJECTION 61% COMPARISON:  July 11, 2013 and May 22, 2016 FINDINGS: Lower chest: No acute abnormality. Hepatobiliary: Hepatic steatosis. The liver, portal vein, and gallbladder are otherwise within normal limits. Pancreas: Unremarkable. No pancreatic ductal dilatation or surrounding inflammatory changes. Spleen: Normal in size without focal abnormality. Adrenals/Urinary Tract: Adrenal glands are unremarkable. Kidneys are normal, without renal calculi, focal lesion, or hydronephrosis. Bladder is  unremarkable. Stomach/Bowel: The stomach and small bowel are normal. There is wall thickening associated with the sigmoid colon. There are a few scattered colonic diverticuli in this region. The patient has a history of diverticulitis involving this region. The pericolonic stranding in this region on the previous study has significantly improved. I suspect there may be some minimal remaining stranding raising the possibility of a low-grade underlying diverticulitis. There is some fluid in the colon which could be seen with diarrhea. There is no other colonic wall thickening however to suggest colitis in other locations. The appendix is normal. Vascular/Lymphatic: Atherosclerotic changes seen in the distal abdominal aorta and iliac vessels. No adenopathy or aneurysm. Reproductive: Prostate is unremarkable. Other: No free air or free fluid. Musculoskeletal: No acute or significant osseous findings. IMPRESSION: 1. Wall thickening associated with the sigmoid colon remains. The patient has had diverticulitis in this region previously and the wall thickening could be at least partially due to previous bouts of diverticulitis. However, there may be some minimal remaining pericolonic stranding in this region raising the possibility of low grade underlying diverticulitis in the appropriate clinical setting. Colonoscopy could evaluate the underlying colon to exclude other causes of wall thickening if not already performed previously. 2. Fluid in the colon could be seen with diarrhea. Recommend clinical correlation. 3. Atherosclerosis as above. Electronically Signed   By: Dorise Bullion III M.D   On: 07/23/2016 21:13    EKG:   Orders placed or performed during the hospital encounter of 04/08/16  . ED EKG within 10 minutes  . ED EKG within 10 minutes  . EKG 12-Lead  . EKG 12-Lead  . EKG 12-Lead  . EKG 12-Lead  . EKG    IMPRESSION AND PLAN:  Principal Problem:   Sepsis ([place]) - IV antibiotics initiated, lactic  acid was within normal limits, patient is hemodynamically stable, cultures sent from the ED including GI panel and C. difficile. Active Problems:   Bloody diarrhea - GI panel showed Escherichia coli and Campylobacter, appropriate antibiotics in Place. Patient's hemoglobin is stable, suspect blood in his diarrhea is more likely due to irritated hemorrhoids   Diabetes (HCC) - sliding scale insulin with corresponding glucose checks   HTN (hypertension) - continue home meds   Anxiety - continue home meds   GERD (gastroesophageal reflux disease) - home dose PPI  All the records are reviewed and case discussed with ED provider. Management plans discussed with the patient and/or family.  DVT PROPHYLAXIS: Mechanical only  GI PROPHYLAXIS: PPI  ADMISSION STATUS: Inpatient  CODE STATUS: Full Code Status History    Date Active Date Inactive Code Status Order ID Comments User Context   04/08/2016  4:33 PM 04/09/2016 12:30 PM Full Code [id number]  Nicholes Mango, MD Inpatient      TOTAL TIME TAKING CARE OF THIS PATIENT: 45 minutes.   Trevyn Lumpkin [place] 07/23/2016, 10:35 PM  [facility]  Office  [phone]  CC: Primary care physician; Theotis Burrow, MD  Note:  This document was prepared  using Systems analyst and may include unintentional dictation errors.

## 2016-07-23 NOTE — ED Notes (Signed)
Pt transported to room 108 

## 2016-07-23 NOTE — ED Notes (Signed)
Pt report he came in today because he has been feeling short of breath, dizzy, feeling cold and "my anxiety has been kicking in". Pt reports he returned Thursday from [place] and the shortness of breath, anxiety and dizziness started on the plane. Pt also report having blood in his stool for the last couple of years. Pt also reports diarrhea.

## 2016-07-23 NOTE — Progress Notes (Signed)
Pharmacy Antibiotic Note  [name] is a 46 y.o. male admitted on 07/23/2016 with Intra-abdominal infection.  Pharmacy has been consulted for Zosyn dosing.  Plan: Will start patient on Zosyn 3.375 IV EI every 8 hours.   Height: 5\' 8"  (172.7 cm) Weight: 171 lb (77.6 kg) IBW/kg (Calculated) : 68.4  Temp (24hrs), Avg:100 F (37.8 C), Min:99.6 F (37.6 C), Max:100.3 F (37.9 C)   Recent Labs Lab 07/23/16 1926 07/23/16 1945  WBC 9.0  --   CREATININE 1.84*  --   LATICACIDVEN  --  1.8    Estimated Creatinine Clearance: 48 mL/min (A) (by C-G formula based on SCr of 1.84 mg/dL (H)).    No Known Allergies  Antimicrobials this admission: 6/16 Zosyn >>   Dose adjustments this admission:  Microbiology results:   Thank you for allowing pharmacy to be a part of this patient's care.  Pernell Dupre, PharmD, BCPS Clinical Pharmacist 07/23/2016 9:49 PM

## 2016-07-23 NOTE — ED Triage Notes (Signed)
Pt states that he has frequent bloody stools over the last week which have been dark blood. Pt reports hx of hemorrhoids at this time. Pt also reports that he came back from [place] in [place] this past Thursday. Pt is shaking with chills in triage but is afebrile at this time.

## 2016-07-24 ENCOUNTER — Encounter: Payer: Self-pay | Admitting: Internal Medicine

## 2016-07-24 LAB — GASTROINTESTINAL PANEL BY PCR, STOOL (REPLACES STOOL CULTURE)
ASTROVIRUS: NOT DETECTED
Adenovirus F40/41: NOT DETECTED
Campylobacter species: DETECTED — AB
Cryptosporidium: NOT DETECTED
Cyclospora cayetanensis: NOT DETECTED
ENTAMOEBA HISTOLYTICA: NOT DETECTED
Enteroaggregative E coli (EAEC): DETECTED — AB
Enteropathogenic E coli (EPEC): DETECTED — AB
Enterotoxigenic E coli (ETEC): DETECTED — AB
GIARDIA LAMBLIA: NOT DETECTED
Norovirus GI/GII: NOT DETECTED
Plesimonas shigelloides: NOT DETECTED
Rotavirus A: NOT DETECTED
SALMONELLA SPECIES: NOT DETECTED
SAPOVIRUS (I, II, IV, AND V): NOT DETECTED
SHIGA LIKE TOXIN PRODUCING E COLI (STEC): NOT DETECTED
SHIGELLA/ENTEROINVASIVE E COLI (EIEC): NOT DETECTED
VIBRIO CHOLERAE: NOT DETECTED
Vibrio species: NOT DETECTED
Yersinia enterocolitica: NOT DETECTED

## 2016-07-24 LAB — CBC
HEMATOCRIT: 34.5 % — AB (ref 40.0–52.0)
HEMOGLOBIN: 11.4 g/dL — AB (ref 13.0–18.0)
MCH: 28.7 pg (ref 26.0–34.0)
MCHC: 33.1 g/dL (ref 32.0–36.0)
MCV: 86.6 fL (ref 80.0–100.0)
Platelets: 204 10*3/uL (ref 150–440)
RBC: 3.99 MIL/uL — ABNORMAL LOW (ref 4.40–5.90)
RDW: 15.4 % — ABNORMAL HIGH (ref 11.5–14.5)
WBC: 6.3 10*3/uL (ref 3.8–10.6)

## 2016-07-24 LAB — BASIC METABOLIC PANEL
ANION GAP: 5 (ref 5–15)
BUN: 24 mg/dL — ABNORMAL HIGH (ref 6–20)
CO2: 18 mmol/L — ABNORMAL LOW (ref 22–32)
Calcium: 8 mg/dL — ABNORMAL LOW (ref 8.9–10.3)
Chloride: 116 mmol/L — ABNORMAL HIGH (ref 101–111)
Creatinine, Ser: 1.33 mg/dL — ABNORMAL HIGH (ref 0.61–1.24)
GFR calc Af Amer: >60 mL/min (ref >60)
GLUCOSE: 105 mg/dL — AB (ref 65–99)
POTASSIUM: 3.4 mmol/L — AB (ref 3.5–5.1)
Sodium: 139 mmol/L (ref 135–145)

## 2016-07-24 LAB — HEMOGLOBIN
HEMOGLOBIN: 11.7 g/dL — AB (ref 13.0–18.0)
HEMOGLOBIN: 11.9 g/dL — AB (ref 13.0–18.0)
Hemoglobin: 11.1 g/dL — ABNORMAL LOW (ref 13.0–18.0)
Hemoglobin: 11.6 g/dL — ABNORMAL LOW (ref 13.0–18.0)

## 2016-07-24 LAB — GLUCOSE, CAPILLARY
GLUCOSE-CAPILLARY: 106 mg/dL — AB (ref 65–99)
GLUCOSE-CAPILLARY: 126 mg/dL — AB (ref 65–99)
Glucose-Capillary: 85 mg/dL (ref 65–99)
Glucose-Capillary: 62 mg/dL — ABNORMAL LOW (ref 65–99)

## 2016-07-24 MED ORDER — VANCOMYCIN HCL 10 G IV SOLR
1250.0000 mg | Freq: Once | INTRAVENOUS | Status: AC
  Administered 2016-07-24: 01:00:00 1250 mg via INTRAVENOUS
  Filled 2016-07-24: qty 1250

## 2016-07-24 MED ORDER — DEXTROSE 5 % IV SOLN
500.0000 mg | INTRAVENOUS | Status: DC
  Administered 2016-07-24 – 2016-07-25 (×2): 500 mg via INTRAVENOUS
  Filled 2016-07-24 (×2): qty 500

## 2016-07-24 MED ORDER — POTASSIUM CHLORIDE CRYS ER 20 MEQ PO TBCR
40.0000 meq | EXTENDED_RELEASE_TABLET | Freq: Once | ORAL | Status: AC
  Administered 2016-07-24: 40 meq via ORAL
  Filled 2016-07-24: qty 2

## 2016-07-24 MED ORDER — SODIUM CHLORIDE 0.9 % IV SOLN
1.0000 g | Freq: Two times a day (BID) | INTRAVENOUS | Status: DC
  Administered 2016-07-24: 1 g via INTRAVENOUS
  Filled 2016-07-24 (×2): qty 1

## 2016-07-24 MED ORDER — SODIUM CHLORIDE 0.9 % IV SOLN
1250.0000 mg | INTRAVENOUS | Status: DC
  Filled 2016-07-24: qty 1250

## 2016-07-24 NOTE — Plan of Care (Signed)
Problem: Fluid Volume: Goal: Ability to maintain a balanced intake and output will improve Outcome: Progressing Pt started on Clear Liquid diet by Dr Michail Sermon (GI Consult) this shift at lunch; pt tolerating to date with no c/o n/v/d

## 2016-07-24 NOTE — Consult Note (Signed)
Referring Provider: Dr. Benjie Karvonen Primary Care Physician:  Alene Mires Elyse Jarvis, MD Primary Gastroenterologist:  Althia Forts  Reason for Consultation:  Bloody diarrhea  HPI: [name] is a 46 y.o. male who has been having intermittent red blood in his stool for over a month that worsened during a recent trip to [place], [place]. Reports having loose NON-bloody stool right before his trip. While in [place] he began having worsened diarrhea (4-5 days) and then the last 2 days he saw blood with his diarrhea. Denies any associated abdominal pain. Had recurrent diaphoresis while in [place] but denies fevers/chills. Reports a normal colonoscopy 2 years ago (records not available at this time). Denies straining prior to the onset of bleeding over a month ago. Denies drinking any fresh water or eating fresh fruits in [place]. His wife has family from [place] and the foods he ate were cooked at her family's house there. He came home early because his daughter was admitted to a high school program at [place]. He reportedly has a colonoscopy that had been scheduled for this coming Thursday. He had acute rectosigmoid diverticulitis in April 2018.  GI pathogen panel positive for Campylobacter and E. Coli (including enterotoxigenic) and started here on Azithromycin for the Campylobacter. Nonbloody diarrhea this morning.  Past Medical History:  Diagnosis Date  . Anxiety   . Chronic back pain   . Chronic neck pain   . Depression   . Diabetes mellitus without complication ([place])   . Enlarged heart    pt report  . Enlarged kidney Left  . GERD (gastroesophageal reflux disease)   . Headache, migraine   . Hypertension     History reviewed. No pertinent surgical history.  Prior to Admission medications   Medication Sig Start Date End Date Taking? Authorizing Provider  Butalbital-APAP-Caffeine (FIORICET PO) Take 1 tablet by mouth daily.    Yes [provider]  hyoscyamine (LEVSIN SL) 0.125 MG SL tablet  Place 0.125 mg under the tongue every 4 (four) hours as needed.   Yes [provider]  lisinopril (PRINIVIL,ZESTRIL) 40 MG tablet Take 40 mg by mouth daily.   Yes [provider]  metFORMIN (GLUCOPHAGE) 500 MG tablet Take by mouth 2 (two) times daily with a meal.   Yes [provider]  metoprolol tartrate (LOPRESSOR) 25 MG tablet Take 25 mg by mouth 2 (two) times daily.   Yes [provider]  nortriptyline (PAMELOR) 50 MG capsule Take 50 mg by mouth at bedtime.   Yes [provider]  pantoprazole (PROTONIX) 40 MG tablet Take 40 mg by mouth daily.   Yes [provider]  HYDROcodone-acetaminophen (NORCO/VICODIN) 5-325 MG tablet Take 1 tablet by mouth every 4 (four) hours as needed. Patient not taking: Reported on 07/23/2016 05/22/16   Harvest Dark, MD  ondansetron (ZOFRAN ODT) 4 MG disintegrating tablet Take 1 tablet (4 mg total) by mouth every 8 (eight) hours as needed for nausea or vomiting. 05/22/16   Harvest Dark, MD    Scheduled Meds: . insulin aspart  0-9 Units Subcutaneous Q6H  . metoprolol tartrate  25 mg Oral BID  . nortriptyline  50 mg Oral QHS  . pantoprazole  40 mg Oral Daily   Continuous Infusions: . azithromycin Stopped (07/24/16 0312)   PRN Meds:.acetaminophen **OR** acetaminophen, ondansetron **OR** ondansetron (ZOFRAN) IV  Allergies as of 07/23/2016  . (No Known Allergies)    Family History  Problem Relation Age of Onset  . Diabetes Mother   . Hypertension Mother   .  Hypertension Father     Social History   Social History  . Marital status: Married    Spouse name: N/A  . Number of children: N/A  . Years of education: N/A   Occupational History  . Not on file.   Social History Main Topics  . Smoking status: Former Research scientist (life sciences)  . Smokeless tobacco: Current User  . Alcohol use No     Comment: rare  . Drug use: No  . Sexual activity: Not on file   Other Topics Concern  . Not on file   Social  History Narrative  . No narrative on file    Review of Systems: All negative except as stated above in HPI.  Physical Exam: Vital signs: Vitals:   07/24/16 0430 07/24/16 1045  BP: 121/66 122/77  Pulse: 94 89  Resp: 20   Temp: 99.8 F (37.7 C)    Last BM Date: 07/23/16 General:   Lethargic, Well-developed, well-nourished, pleasant and cooperative in NAD HEENT: anicteric sclera, oropharynx clear Neck: supple, nontender Lungs:  Clear throughout to auscultation.   No wheezes, crackles, or rhonchi. No acute distress. Heart:  Regular rate and rhythm; no murmurs, clicks, rubs,  or gallops. Abdomen: soft, nontender, nondistended, +BS  Rectal:  Deferred Ext: no edema  GI:  Lab Results:  Recent Labs  07/23/16 1926 07/23/16 2355 07/24/16 0448 07/24/16 1116  WBC 9.0  --  6.3  --   HGB 13.5 11.6* 11.4* 11.7*  HCT 40.7  --  34.5*  --   PLT 252  --  204  --    BMET  Recent Labs  07/23/16 1926 07/24/16 0448  NA 136 139  K 3.5 3.4*  CL 110 116*  CO2 19* 18*  GLUCOSE 92 105*  BUN 38* 24*  CREATININE 1.84* 1.33*  CALCIUM 9.2 8.0*   LFT  Recent Labs  07/23/16 1926  PROT 8.3*  ALBUMIN 4.1  AST 37  ALT 57  ALKPHOS 125  BILITOT 0.9   PT/INR No results for input(s): LABPROT, INR in the last 72 hours.   Studies/Results: Dg Chest 2 View  Result Date: 07/23/2016 CLINICAL DATA:  Weakness and fever EXAM: CHEST  2 VIEW COMPARISON:  04/08/2016 FINDINGS: No focal pulmonary infiltrate, consolidation, or effusion. Stable cardiomediastinal silhouette. No pneumothorax. Chronic deformity of the proximal right humerus. IMPRESSION: No active cardiopulmonary disease. Electronically Signed   By: Donavan Foil M.D.   On: 07/23/2016 21:10   Ct Abdomen Pelvis W Contrast  Result Date: 07/23/2016 CLINICAL DATA:  Frequent bloody stools over the last week. History of hemorrhoids. EXAM: CT ABDOMEN AND PELVIS WITH CONTRAST TECHNIQUE: Multidetector CT imaging of the abdomen and pelvis was  performed using the standard protocol following bolus administration of intravenous contrast. CONTRAST:  71mL ISOVUE-300 IOPAMIDOL (ISOVUE-300) INJECTION 61% COMPARISON:  July 11, 2013 and May 22, 2016 FINDINGS: Lower chest: No acute abnormality. Hepatobiliary: Hepatic steatosis. The liver, portal vein, and gallbladder are otherwise within normal limits. Pancreas: Unremarkable. No pancreatic ductal dilatation or surrounding inflammatory changes. Spleen: Normal in size without focal abnormality. Adrenals/Urinary Tract: Adrenal glands are unremarkable. Kidneys are normal, without renal calculi, focal lesion, or hydronephrosis. Bladder is unremarkable. Stomach/Bowel: The stomach and small bowel are normal. There is wall thickening associated with the sigmoid colon. There are a few scattered colonic diverticuli in this region. The patient has a history of diverticulitis involving this region. The pericolonic stranding in this region on the previous study has significantly improved. I suspect there may be  some minimal remaining stranding raising the possibility of a low-grade underlying diverticulitis. There is some fluid in the colon which could be seen with diarrhea. There is no other colonic wall thickening however to suggest colitis in other locations. The appendix is normal. Vascular/Lymphatic: Atherosclerotic changes seen in the distal abdominal aorta and iliac vessels. No adenopathy or aneurysm. Reproductive: Prostate is unremarkable. Other: No free air or free fluid. Musculoskeletal: No acute or significant osseous findings. IMPRESSION: 1. Wall thickening associated with the sigmoid colon remains. The patient has had diverticulitis in this region previously and the wall thickening could be at least partially due to previous bouts of diverticulitis. However, there may be some minimal remaining pericolonic stranding in this region raising the possibility of low grade underlying diverticulitis in the appropriate  clinical setting. Colonoscopy could evaluate the underlying colon to exclude other causes of wall thickening if not already performed previously. 2. Fluid in the colon could be seen with diarrhea. Recommend clinical correlation. 3. Atherosclerosis as above. Electronically Signed   By: Dorise Bullion III M.D   On: 07/23/2016 21:13    Impression/Plan: 46 yo with infectious colitis with Campylobacter and ETEC after traveling to [place], [place]. CT scan shows sigmoid wall thickening but patient is not having abdominal pain and I do not think he has a recurrence of acute diverticulitis. Agree with Azithromycin treatment for his infectious colitis. Clear liquid diet. Supportive care. Consider postponing outpt colonoscopy if he improves on the Abx to next month but defer to Dr. Vicente Males (patient not sure who in [facility] was going to do his colonoscopy). Dr. Vicente Males to f/u tomorrow.    LOS: 1 day   Zalma C.  07/24/2016, 1:16 PM

## 2016-07-24 NOTE — Progress Notes (Addendum)
[place] at [place] NAME: [name]    MR#:  [id number]  DATE OF BIRTH:  26-Aug-1970  SUBJECTIVE:   Patient reports he was having bloody and watery diarrhea prior to his trip to [place] however more pronounced after [place]. He is denying any abdominal pain. He occasionally has cramping associated with the diarrhea. He denies nausea or vomiting.  REVIEW OF SYSTEMS:    Review of Systems  Constitutional: Negative for fever, chills weight loss HENT: Negative for ear pain, nosebleeds, congestion, facial swelling, rhinorrhea, neck pain, neck stiffness and ear discharge.   Respiratory: Negative for cough, shortness of breath, wheezing  Cardiovascular: Negative for chest pain, palpitations and leg swelling.  Gastrointestinal: Positive watery/bloody diarrhea no constipation no nausea or vomiting  Genitourinary: Negative for dysuria, urgency, frequency, hematuria Musculoskeletal: Negative for back pain or joint pain Neurological: Negative for dizziness, seizures, syncope, focal weakness,  numbness and headaches.  Hematological: Does not bruise/bleed easily.  Psychiatric/Behavioral: Negative for hallucinations, confusion, dysphoric mood    Tolerating Diet: npo      DRUG ALLERGIES:  No Known Allergies  VITALS:  Blood pressure 121/66, pulse 94, temperature 99.8 F (37.7 C), temperature source Oral, resp. rate 20, height 5\' 8"  (1.727 m), weight 81.1 kg (178 lb 12.8 oz), SpO2 100 %.  PHYSICAL EXAMINATION:  Constitutional: Appears well-developed and well-nourished. No distress. HENT: Normocephalic. Marland Kitchen Oropharynx is clear and moist.  Eyes: Conjunctivae and EOM are normal. PERRLA, no scleral icterus.  Neck: Normal ROM. Neck supple. No JVD. No tracheal deviation. CVS: RRR, S1/S2 +, no murmurs, no gallops, no carotid bruit.  Pulmonary: Effort and breath sounds normal, no stridor, rhonchi, wheezes, rales.  Abdominal: Soft. BS +,  no distension,  tenderness, rebound or guarding.  Musculoskeletal: Normal range of motion. No edema and no tenderness.  Neuro: Alert. CN 2-12 grossly intact. No focal deficits. Skin: Skin is warm and dry. No rash noted. Psychiatric: Normal mood and affect.      LABORATORY PANEL:   CBC  Recent Labs Lab 07/24/16 0448  WBC 6.3  HGB 11.4*  HCT 34.5*  PLT 204   ------------------------------------------------------------------------------------------------------------------  Chemistries   Recent Labs Lab 07/23/16 1926 07/24/16 0448  NA 136 139  K 3.5 3.4*  CL 110 116*  CO2 19* 18*  GLUCOSE 92 105*  BUN 38* 24*  CREATININE 1.84* 1.33*  CALCIUM 9.2 8.0*  AST 37  --   ALT 57  --   ALKPHOS 125  --   BILITOT 0.9  --    ------------------------------------------------------------------------------------------------------------------  Cardiac Enzymes  Recent Labs Lab 07/23/16 1926  TROPONINI <0.03   ------------------------------------------------------------------------------------------------------------------  RADIOLOGY:  Dg Chest 2 View  Result Date: 07/23/2016 CLINICAL DATA:  Weakness and fever EXAM: CHEST  2 VIEW COMPARISON:  04/08/2016 FINDINGS: No focal pulmonary infiltrate, consolidation, or effusion. Stable cardiomediastinal silhouette. No pneumothorax. Chronic deformity of the proximal right humerus. IMPRESSION: No active cardiopulmonary disease. Electronically Signed   By: Donavan Foil M.D.   On: 07/23/2016 21:10   Ct Abdomen Pelvis W Contrast  Result Date: 07/23/2016 CLINICAL DATA:  Frequent bloody stools over the last week. History of hemorrhoids. EXAM: CT ABDOMEN AND PELVIS WITH CONTRAST TECHNIQUE: Multidetector CT imaging of the abdomen and pelvis was performed using the standard protocol following bolus administration of intravenous contrast. CONTRAST:  24mL ISOVUE-300 IOPAMIDOL (ISOVUE-300) INJECTION 61% COMPARISON:  July 11, 2013 and May 22, 2016 FINDINGS: Lower  chest: No acute abnormality. Hepatobiliary: Hepatic steatosis.  The liver, portal vein, and gallbladder are otherwise within normal limits. Pancreas: Unremarkable. No pancreatic ductal dilatation or surrounding inflammatory changes. Spleen: Normal in size without focal abnormality. Adrenals/Urinary Tract: Adrenal glands are unremarkable. Kidneys are normal, without renal calculi, focal lesion, or hydronephrosis. Bladder is unremarkable. Stomach/Bowel: The stomach and small bowel are normal. There is wall thickening associated with the sigmoid colon. There are a few scattered colonic diverticuli in this region. The patient has a history of diverticulitis involving this region. The pericolonic stranding in this region on the previous study has significantly improved. I suspect there may be some minimal remaining stranding raising the possibility of a low-grade underlying diverticulitis. There is some fluid in the colon which could be seen with diarrhea. There is no other colonic wall thickening however to suggest colitis in other locations. The appendix is normal. Vascular/Lymphatic: Atherosclerotic changes seen in the distal abdominal aorta and iliac vessels. No adenopathy or aneurysm. Reproductive: Prostate is unremarkable. Other: No free air or free fluid. Musculoskeletal: No acute or significant osseous findings. IMPRESSION: 1. Wall thickening associated with the sigmoid colon remains. The patient has had diverticulitis in this region previously and the wall thickening could be at least partially due to previous bouts of diverticulitis. However, there may be some minimal remaining pericolonic stranding in this region raising the possibility of low grade underlying diverticulitis in the appropriate clinical setting. Colonoscopy could evaluate the underlying colon to exclude other causes of wall thickening if not already performed previously. 2. Fluid in the colon could be seen with diarrhea. Recommend clinical  correlation. 3. Atherosclerosis as above. Electronically Signed   By: Dorise Bullion III M.D   On: 07/23/2016 21:13     ASSESSMENT AND PLAN:   46 year old male with a history of diabetes who presents with bloody diarrhea.  1. Sepsis: Patient presents with fever, tachycardia and tachypnea. Sepsis from Escherichia coli and Campylobacter diarrhea Continue IV fluids Follow-up on final blood cultures Patient has no clinical symptoms or signs of diverticulitis. I will discontinue vancomycin and meropenem. 2. Bloody diarrhea: GI panel is positive for Campylobacter which can contribute to bloody diarrhea Patient is on azithromycin for Campylobacter. Treat for a total of 3 days. Patient reporting bloody diarrhea started prior to history to [place] and he is planned for colonoscopy on Thursday. Follow-up on GI recommendations Currently nothing by mouth for possible further luminal evaluation today. Follow CBC   3. Hypokalemia from diarrhea: Replete and recheck in a.m.  4. Acute kidney injury: This hs improved with IV fluids.  5. Essential hypertension: Continue metoprolol 6. Diabetes:Continue SSI   Management plans discussed with the patient and he is in agreement.  CODE STATUS: full  TOTAL TIME TAKING CARE OF THIS PATIENT: 30 minutes.     POSSIBLE D/C 1-2 days, DEPENDING ON CLINICAL CONDITION.   Palma Buster M.D on 07/24/2016 at 8:11 AM  Between 7am to 6pm - Pager - [phone] After 6pm go to [email or web address] - password [email or web address] [facility]  Office  [phone]  CC: Primary care physician; Theotis Burrow, MD  Note: This dictation was prepared with Dragon dictation along with smaller phrase technology. Any transcriptional errors that result from this process are unintentional.

## 2016-07-24 NOTE — Progress Notes (Addendum)
Pharmacy Antibiotic Note  [name] is a 46 y.o. male admitted on 07/23/2016 with sepsis/intra-abdominal infection.  Pharmacy has been consulted for vancomycin and meropenem dosing.  Plan: DW 81kg  Vd 57L kei 0.044 hr-1  T1/2 16 hours Vancomycin 1250 mg q 18 hours ordered with stacked dosing. Level before 5th dose. Goal trough 15-20.  Meropenem 1 gram q 12 hours ordered.  Height: 5\' 8"  (172.7 cm) Weight: 178 lb 12.8 oz (81.1 kg) IBW/kg (Calculated) : 68.4  Temp (24hrs), Avg:99.6 F (37.6 C), Min:98.8 F (37.1 C), Max:100.3 F (37.9 C)   Recent Labs Lab 07/23/16 1926 07/23/16 1945 07/23/16 2223  WBC 9.0  --   --   CREATININE 1.84*  --   --   LATICACIDVEN  --  1.8 0.4*    Estimated Creatinine Clearance: 48 mL/min (A) (by C-G formula based on SCr of 1.84 mg/dL (H)).    No Known Allergies  Antimicrobials this admission: Zosyn x1  >> meropenem, azithromycin 6/17 Vancomycin 6/17   >>   Dose adjustments this admission:   Microbiology results: 6/16 BCx: pending 6/17 UCx: pending       6/17 CXR: no active disease  Thank you for allowing pharmacy to be a part of this patient's care.  [name] S 07/24/2016 12:13 AM

## 2016-07-25 LAB — CBC
HEMATOCRIT: 34.7 % — AB (ref 40.0–52.0)
Hemoglobin: 11.6 g/dL — ABNORMAL LOW (ref 13.0–18.0)
MCH: 28.9 pg (ref 26.0–34.0)
MCHC: 33.5 g/dL (ref 32.0–36.0)
MCV: 86.3 fL (ref 80.0–100.0)
PLATELETS: 196 10*3/uL (ref 150–440)
RBC: 4.02 MIL/uL — ABNORMAL LOW (ref 4.40–5.90)
RDW: 15.3 % — AB (ref 11.5–14.5)
WBC: 5 10*3/uL (ref 3.8–10.6)

## 2016-07-25 LAB — BASIC METABOLIC PANEL
ANION GAP: 4 — AB (ref 5–15)
BUN: 9 mg/dL (ref 6–20)
CALCIUM: 8.4 mg/dL — AB (ref 8.9–10.3)
CO2: 22 mmol/L (ref 22–32)
Chloride: 111 mmol/L (ref 101–111)
Creatinine, Ser: 1.04 mg/dL (ref 0.61–1.24)
GFR calc Af Amer: >60 mL/min (ref >60)
GLUCOSE: 101 mg/dL — AB (ref 65–99)
Potassium: 3.3 mmol/L — ABNORMAL LOW (ref 3.5–5.1)
Sodium: 137 mmol/L (ref 135–145)

## 2016-07-25 LAB — GLUCOSE, CAPILLARY
Glucose-Capillary: 103 mg/dL — ABNORMAL HIGH (ref 65–99)
Glucose-Capillary: 83 mg/dL (ref 65–99)
Glucose-Capillary: 97 mg/dL (ref 65–99)

## 2016-07-25 MED ORDER — PROBIOTIC DAILY PO CAPS: 1.0000 | ORAL_CAPSULE | Freq: Two times a day (BID) | ORAL | 0 refills | Status: AC

## 2016-07-25 MED ORDER — POTASSIUM CHLORIDE CRYS ER 20 MEQ PO TBCR
40.0000 meq | EXTENDED_RELEASE_TABLET | Freq: Once | ORAL | Status: AC
  Administered 2016-07-25: 09:00:00 40 meq via ORAL
  Filled 2016-07-25: qty 2

## 2016-07-25 MED ORDER — AZITHROMYCIN 500 MG PO TABS: 500.0000 mg | ORAL_TABLET | Freq: Every day | ORAL | 0 refills | Status: AC

## 2016-07-25 MED ORDER — AZITHROMYCIN 500 MG PO TABS
500.0000 mg | ORAL_TABLET | Freq: Every day | ORAL | Status: DC
  Administered 2016-07-25: 500 mg via ORAL
  Filled 2016-07-25: qty 1

## 2016-07-25 NOTE — Progress Notes (Signed)
Discussed discharge instructions and medications with pt. IV removed. All questions addressed. Pt transported home via car by his spouse.  Clarise Cruz, RN

## 2016-07-25 NOTE — Discharge Summary (Signed)
[place] at [place] NAME: [name]    MR#:  [id number]  DATE OF BIRTH:  04/26/1970  DATE OF ADMISSION:  07/23/2016 ADMITTING PHYSICIAN: Lance Coon, MD  DATE OF DISCHARGE: 07/25/2016  PRIMARY CARE PHYSICIAN: Theotis Burrow, MD    ADMISSION DIAGNOSIS:  Diarrhea of presumed infectious origin [R19.7] Dehydration [E86.0] Sepsis, due to unspecified organism [facility]) [A41.9]  DISCHARGE DIAGNOSIS:  Principal Problem:   Sepsis ([place]) Active Problems:   Bloody diarrhea   Diabetes ([place])   HTN (hypertension)   Anxiety   GERD (gastroesophageal reflux disease)   SECONDARY DIAGNOSIS:   Past Medical History:  Diagnosis Date  . Anxiety   . Chronic back pain   . Chronic neck pain   . Depression   . Diabetes mellitus without complication ([place])   . Enlarged heart    pt report  . Enlarged kidney Left  . GERD (gastroesophageal reflux disease)   . Headache, migraine   . Hypertension     HOSPITAL COURSE:   46 y.o. male who has been having intermittent red blood in his stool for over a month that worsened during a recent trip to [place], [place].  1. Infectious colitis with Campylobacter and ETEC after traveling to [place]: Patient was evaluated by GI. CT scan showed sigmoid wall thickening but patient was not having any symptoms or diverticulitis. Patient was started on azithromycin and probiotics. Patient is planned for colonoscopy on Thursday however this is too soon. Colonoscopy will need to be changed to 3-4 weeks. Case was discussed with GI attending.  2. Bloody diarrhea due to Campylobacter Patient will continue treatment with azithromycin Repeat stool cultures indicated in 1-2 weeks. Hemoglobin is stable  3. Hypokalemia from diarrhea which is improved  4. Acute kidney injury: This hs improved with IV fluids.  5. Essential hypertension: Continue metoprolol and lisinopril 6. Diabetes:Continue outpatient  medications  DISCHARGE CONDITIONS AND DIET:   Stable Regular diet  CONSULTS OBTAINED:  Treatment Team:  [name], MD  DRUG ALLERGIES:  No Known Allergies  DISCHARGE MEDICATIONS:   Current Discharge Medication List    START taking these medications   Details  azithromycin (ZITHROMAX) 500 MG tablet Take 1 tablet (500 mg total) by mouth daily. Qty: 4 tablet, Refills: 0    Probiotic Product (PROBIOTIC DAILY) CAPS Take 1 capsule by mouth 2 (two) times daily. Qty: 90 capsule, Refills: 0      CONTINUE these medications which have NOT CHANGED   Details  Butalbital-APAP-Caffeine (FIORICET PO) Take 1 tablet by mouth daily.     hyoscyamine (LEVSIN SL) 0.125 MG SL tablet Place 0.125 mg under the tongue every 4 (four) hours as needed.    lisinopril (PRINIVIL,ZESTRIL) 40 MG tablet Take 40 mg by mouth daily.    metFORMIN (GLUCOPHAGE) 500 MG tablet Take by mouth 2 (two) times daily with a meal.    metoprolol tartrate (LOPRESSOR) 25 MG tablet Take 25 mg by mouth 2 (two) times daily.    nortriptyline (PAMELOR) 50 MG capsule Take 50 mg by mouth at bedtime.    pantoprazole (PROTONIX) 40 MG tablet Take 40 mg by mouth daily.    ondansetron (ZOFRAN ODT) 4 MG disintegrating tablet Take 1 tablet (4 mg total) by mouth every 8 (eight) hours as needed for nausea or vomiting. Qty: 20 tablet, Refills: 0      STOP taking these medications     HYDROcodone-acetaminophen (NORCO/VICODIN) 5-325 MG tablet  Today   CHIEF COMPLAINT:  Doing well BM improved   VITAL SIGNS:  Blood pressure 116/84, pulse 75, temperature 98.5 F (36.9 C), temperature source Oral, resp. rate 19, height 5\' 8"  (1.727 m), weight 81.1 kg (178 lb 12.8 oz), SpO2 100 %.   REVIEW OF SYSTEMS:  Review of Systems  Constitutional: Negative.  Negative for chills, fever and malaise/fatigue.  HENT: Negative.  Negative for ear discharge, ear pain, hearing loss, nosebleeds and sore throat.   Eyes: Negative.   Negative for blurred vision and pain.  Respiratory: Negative.  Negative for cough, hemoptysis, shortness of breath and wheezing.   Cardiovascular: Negative.  Negative for chest pain, palpitations and leg swelling.  Gastrointestinal: Positive for diarrhea (better). Negative for abdominal pain, blood in stool, nausea and vomiting.  Genitourinary: Negative.  Negative for dysuria.  Musculoskeletal: Negative.  Negative for back pain.  Skin: Negative.   Neurological: Negative for dizziness, tremors, speech change, focal weakness, seizures and headaches.  Endo/Heme/Allergies: Negative.  Does not bruise/bleed easily.  Psychiatric/Behavioral: Negative.  Negative for depression, hallucinations and suicidal ideas.     PHYSICAL EXAMINATION:  GENERAL:  46 y.o.-year-old patient lying in the bed with no acute distress.  NECK:  Supple, no jugular venous distention. No thyroid enlargement, no tenderness.  LUNGS: Normal breath sounds bilaterally, no wheezing, rales,rhonchi  No use of accessory muscles of respiration.  CARDIOVASCULAR: S1, S2 normal. No murmurs, rubs, or gallops.  ABDOMEN: Soft, non-tender, non-distended. Bowel sounds present. No organomegaly or mass.  EXTREMITIES: No pedal edema, cyanosis, or clubbing.  PSYCHIATRIC: The patient is alert and oriented x 3.  SKIN: No obvious rash, lesion, or ulcer.   DATA REVIEW:   CBC  Recent Labs Lab 07/25/16 0451  WBC 5.0  HGB 11.6*  HCT 34.7*  PLT 196    Chemistries   Recent Labs Lab 07/23/16 1926  07/25/16 0451  NA 136  < > 137  K 3.5  < > 3.3*  CL 110  < > 111  CO2 19*  < > 22  GLUCOSE 92  < > 101*  BUN 38*  < > 9  CREATININE 1.84*  < > 1.04  CALCIUM 9.2  < > 8.4*  AST 37  --   --   ALT 57  --   --   ALKPHOS 125  --   --   BILITOT 0.9  --   --   < > = values in this interval not displayed.  Cardiac Enzymes  Recent Labs Lab 07/23/16 1926  TROPONINI <0.03    Microbiology Results  @[email or web address] @  RADIOLOGY:  Dg Chest 2  View  Result Date: 07/23/2016 CLINICAL DATA:  Weakness and fever EXAM: CHEST  2 VIEW COMPARISON:  04/08/2016 FINDINGS: No focal pulmonary infiltrate, consolidation, or effusion. Stable cardiomediastinal silhouette. No pneumothorax. Chronic deformity of the proximal right humerus. IMPRESSION: No active cardiopulmonary disease. Electronically Signed   By: Donavan Foil M.D.   On: 07/23/2016 21:10   Ct Abdomen Pelvis W Contrast  Result Date: 07/23/2016 CLINICAL DATA:  Frequent bloody stools over the last week. History of hemorrhoids. EXAM: CT ABDOMEN AND PELVIS WITH CONTRAST TECHNIQUE: Multidetector CT imaging of the abdomen and pelvis was performed using the standard protocol following bolus administration of intravenous contrast. CONTRAST:  46mL ISOVUE-300 IOPAMIDOL (ISOVUE-300) INJECTION 61% COMPARISON:  July 11, 2013 and May 22, 2016 FINDINGS: Lower chest: No acute abnormality. Hepatobiliary: Hepatic steatosis. The liver, portal vein, and gallbladder are otherwise within normal limits. Pancreas: Unremarkable.  No pancreatic ductal dilatation or surrounding inflammatory changes. Spleen: Normal in size without focal abnormality. Adrenals/Urinary Tract: Adrenal glands are unremarkable. Kidneys are normal, without renal calculi, focal lesion, or hydronephrosis. Bladder is unremarkable. Stomach/Bowel: The stomach and small bowel are normal. There is wall thickening associated with the sigmoid colon. There are a few scattered colonic diverticuli in this region. The patient has a history of diverticulitis involving this region. The pericolonic stranding in this region on the previous study has significantly improved. I suspect there may be some minimal remaining stranding raising the possibility of a low-grade underlying diverticulitis. There is some fluid in the colon which could be seen with diarrhea. There is no other colonic wall thickening however to suggest colitis in other locations. The appendix is normal.  Vascular/Lymphatic: Atherosclerotic changes seen in the distal abdominal aorta and iliac vessels. No adenopathy or aneurysm. Reproductive: Prostate is unremarkable. Other: No free air or free fluid. Musculoskeletal: No acute or significant osseous findings. IMPRESSION: 1. Wall thickening associated with the sigmoid colon remains. The patient has had diverticulitis in this region previously and the wall thickening could be at least partially due to previous bouts of diverticulitis. However, there may be some minimal remaining pericolonic stranding in this region raising the possibility of low grade underlying diverticulitis in the appropriate clinical setting. Colonoscopy could evaluate the underlying colon to exclude other causes of wall thickening if not already performed previously. 2. Fluid in the colon could be seen with diarrhea. Recommend clinical correlation. 3. Atherosclerosis as above. Electronically Signed   By: Dorise Bullion III M.D   On: 07/23/2016 21:13      Current Discharge Medication List    START taking these medications   Details  azithromycin (ZITHROMAX) 500 MG tablet Take 1 tablet (500 mg total) by mouth daily. Qty: 4 tablet, Refills: 0    Probiotic Product (PROBIOTIC DAILY) CAPS Take 1 capsule by mouth 2 (two) times daily. Qty: 90 capsule, Refills: 0      CONTINUE these medications which have NOT CHANGED   Details  Butalbital-APAP-Caffeine (FIORICET PO) Take 1 tablet by mouth daily.     hyoscyamine (LEVSIN SL) 0.125 MG SL tablet Place 0.125 mg under the tongue every 4 (four) hours as needed.    lisinopril (PRINIVIL,ZESTRIL) 40 MG tablet Take 40 mg by mouth daily.    metFORMIN (GLUCOPHAGE) 500 MG tablet Take by mouth 2 (two) times daily with a meal.    metoprolol tartrate (LOPRESSOR) 25 MG tablet Take 25 mg by mouth 2 (two) times daily.    nortriptyline (PAMELOR) 50 MG capsule Take 50 mg by mouth at bedtime.    pantoprazole (PROTONIX) 40 MG tablet Take 40 mg by  mouth daily.    ondansetron (ZOFRAN ODT) 4 MG disintegrating tablet Take 1 tablet (4 mg total) by mouth every 8 (eight) hours as needed for nausea or vomiting. Qty: 20 tablet, Refills: 0      STOP taking these medications     HYDROcodone-acetaminophen (NORCO/VICODIN) 5-325 MG tablet             Management plans discussed with the patient and he is in agreement. Stable for discharge home  Patient should follow up with pcp  CODE STATUS:     Code Status Orders        Start     Ordered   07/23/16 2335  Full code  Continuous     07/23/16 2335    Code Status History    Date Active Date  Inactive Code Status Order ID Comments User Context   04/08/2016  4:33 PM 04/09/2016 12:30 PM Full Code [id number]  Nicholes Mango, MD Inpatient      TOTAL TIME TAKING CARE OF THIS PATIENT: 38 minutes.    Note: This dictation was prepared with Dragon dictation along with smaller phrase technology. Any transcriptional errors that result from this process are unintentional.  Shaurya Rawdon M.D on 07/25/2016 at 7:54 AM  Between 7am to 6pm - Pager - [phone] After 6pm go to [email or web address] - password [email or web address] [facility]  Office  [phone]  CC: Primary care physician; Theotis Burrow, MD

## 2016-07-25 NOTE — Care Management (Signed)
No discharge needs identified by members of the care team 

## 2016-07-27 LAB — URINE CULTURE: CULTURE: NO GROWTH

## 2016-07-28 LAB — CULTURE, BLOOD (ROUTINE X 2)
CULTURE: NO GROWTH
CULTURE: NO GROWTH
SPECIAL REQUESTS: ADEQUATE

## 2016-08-03 ENCOUNTER — Telehealth: Payer: Self-pay

## 2016-08-03 ENCOUNTER — Other Ambulatory Visit: Payer: Self-pay

## 2016-08-03 ENCOUNTER — Ambulatory Visit (INDEPENDENT_AMBULATORY_CARE_PROVIDER_SITE_OTHER): Payer: Medicare Other | Admitting: Gastroenterology

## 2016-08-03 ENCOUNTER — Encounter: Payer: Self-pay | Admitting: Gastroenterology

## 2016-08-03 VITALS — BP 126/80 | HR 93 | Temp 97.9°F | Ht 68.0 in | Wt 185.6 lb

## 2016-08-03 DIAGNOSIS — K625 Hemorrhage of anus and rectum: Secondary | ICD-10-CM

## 2016-08-03 DIAGNOSIS — A09 Infectious gastroenteritis and colitis, unspecified: Secondary | ICD-10-CM

## 2016-08-03 NOTE — Progress Notes (Signed)
[name] MD, MRCP(U.K) [address]  [place]  [place], [place] [address]  Main: [phone]  Fax: [phone]   Primary Care Physician: Theotis Burrow, MD  Primary Gastroenterologist:  Dr. [name]   No chief complaint on file.   HPI: [name] is a 46 y.o. male    He is here today for a hospital follow up . He was admitted on 07/23/16 with bloody diarrhea. He has had intermittent blood in his stool . Recent trip to [place]. Diarrhea got worse during his travel . Recent episode of acute diverticulitis in 05/2016. During his hospitalization his stool tested positive for Campylobacter and Ecoli ETEC. Commenced on Azithromycin.   Since his discharge he has been doing well / Completed the antibiotics. 1-2 occasions since discharge that he has noticed some blood in his stool after wiping he noticed it more.    Current Outpatient Prescriptions  Medication Sig Dispense Refill  . Butalbital-APAP-Caffeine (FIORICET PO) Take 1 tablet by mouth daily.     Marland Kitchen gabapentin (NEURONTIN) 300 MG capsule TAKE 1 CAPSULE (300 MG TOTAL) BY MOUTH 2 (TWO) TIMES DAILY.  2  . hyoscyamine (LEVSIN SL) 0.125 MG SL tablet Place 0.125 mg under the tongue every 4 (four) hours as needed.    Marland Kitchen lisinopril (PRINIVIL,ZESTRIL) 40 MG tablet Take 40 mg by mouth daily.    . metFORMIN (GLUCOPHAGE) 500 MG tablet Take by mouth 2 (two) times daily with a meal.    . metoprolol tartrate (LOPRESSOR) 25 MG tablet Take 25 mg by mouth 2 (two) times daily.    . nortriptyline (PAMELOR) 50 MG capsule Take 50 mg by mouth at bedtime.    . ondansetron (ZOFRAN ODT) 4 MG disintegrating tablet Take 1 tablet (4 mg total) by mouth every 8 (eight) hours as needed for nausea or vomiting. 20 tablet 0  . pantoprazole (PROTONIX) 40 MG tablet Take 40 mg by mouth daily.    . polyethylene glycol powder (GLYCOLAX/MIRALAX) powder TAKE 255 G BY MOUTH ONCE DAILY FOR 1 DAY. TAKE AS DIRECTED FOR COLONOSCOPY.  0  . Probiotic  Product (PROBIOTIC DAILY) CAPS Take 1 capsule by mouth 2 (two) times daily. 90 capsule 0   No current facility-administered medications for this visit.     Allergies as of 08/03/2016 - Review Complete 07/23/2016  Allergen Reaction Noted  . Hydrocodone Other (See Comments) 06/13/2016    ROS:  General: Negative for anorexia, weight loss, fever, chills, fatigue, weakness. ENT: Negative for hoarseness, difficulty swallowing , nasal congestion. CV: Negative for chest pain, angina, palpitations, dyspnea on exertion, peripheral edema.  Respiratory: Negative for dyspnea at rest, dyspnea on exertion, cough, sputum, wheezing.  GI: See history of present illness. GU:  Negative for dysuria, hematuria, urinary incontinence, urinary frequency, nocturnal urination.  Endo: Negative for unusual weight change.    Physical Examination:   There were no vitals taken for this visit.  General: Well-nourished, well-developed in no acute distress.  Eyes: No icterus. Conjunctivae pink. Mouth: Oropharyngeal mucosa moist and pink , no lesions erythema or exudate. Lungs: Clear to auscultation bilaterally. Non-labored. Heart: Regular rate and rhythm, no murmurs rubs or gallops.  Abdomen: Bowel sounds are normal, nontender, nondistended, no hepatosplenomegaly or masses, no abdominal bruits or hernia , no rebound or guarding.   Extremities: No lower extremity edema. No clubbing or deformities. Neuro: Alert and oriented x 3.  Grossly intact. Skin: Warm and dry, no jaundice.   Psych: Alert and cooperative, normal mood and  affect.   Imaging Studies: Dg Chest 2 View  Result Date: 07/23/2016 CLINICAL DATA:  Weakness and fever EXAM: CHEST  2 VIEW COMPARISON:  04/08/2016 FINDINGS: No focal pulmonary infiltrate, consolidation, or effusion. Stable cardiomediastinal silhouette. No pneumothorax. Chronic deformity of the proximal right humerus. IMPRESSION: No active cardiopulmonary disease. Electronically Signed   By:  Donavan Foil M.D.   On: 07/23/2016 21:10   Ct Abdomen Pelvis W Contrast  Result Date: 07/23/2016 CLINICAL DATA:  Frequent bloody stools over the last week. History of hemorrhoids. EXAM: CT ABDOMEN AND PELVIS WITH CONTRAST TECHNIQUE: Multidetector CT imaging of the abdomen and pelvis was performed using the standard protocol following bolus administration of intravenous contrast. CONTRAST:  18mL ISOVUE-300 IOPAMIDOL (ISOVUE-300) INJECTION 61% COMPARISON:  July 11, 2013 and May 22, 2016 FINDINGS: Lower chest: No acute abnormality. Hepatobiliary: Hepatic steatosis. The liver, portal vein, and gallbladder are otherwise within normal limits. Pancreas: Unremarkable. No pancreatic ductal dilatation or surrounding inflammatory changes. Spleen: Normal in size without focal abnormality. Adrenals/Urinary Tract: Adrenal glands are unremarkable. Kidneys are normal, without renal calculi, focal lesion, or hydronephrosis. Bladder is unremarkable. Stomach/Bowel: The stomach and small bowel are normal. There is wall thickening associated with the sigmoid colon. There are a few scattered colonic diverticuli in this region. The patient has a history of diverticulitis involving this region. The pericolonic stranding in this region on the previous study has significantly improved. I suspect there may be some minimal remaining stranding raising the possibility of a low-grade underlying diverticulitis. There is some fluid in the colon which could be seen with diarrhea. There is no other colonic wall thickening however to suggest colitis in other locations. The appendix is normal. Vascular/Lymphatic: Atherosclerotic changes seen in the distal abdominal aorta and iliac vessels. No adenopathy or aneurysm. Reproductive: Prostate is unremarkable. Other: No free air or free fluid. Musculoskeletal: No acute or significant osseous findings. IMPRESSION: 1. Wall thickening associated with the sigmoid colon remains. The patient has had  diverticulitis in this region previously and the wall thickening could be at least partially due to previous bouts of diverticulitis. However, there may be some minimal remaining pericolonic stranding in this region raising the possibility of low grade underlying diverticulitis in the appropriate clinical setting. Colonoscopy could evaluate the underlying colon to exclude other causes of wall thickening if not already performed previously. 2. Fluid in the colon could be seen with diarrhea. Recommend clinical correlation. 3. Atherosclerosis as above. Electronically Signed   By: Dorise Bullion III M.D   On: 07/23/2016 21:13    Assessment and Plan:   [name] is a 46 y.o. 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 Recent travel to [place]. Infectious diarrhea has resolved. We will evaluate the rectal bleeding with a colonoscopy .   Plan  1. Colonoscopy in after 6 weeks .    I have discussed alternative options, risks & benefits,  which include, but are not limited to, bleeding, infection, perforation,respiratory complication & drug reaction.  The patient agrees with this plan & written consent will be obtained.      Dr [name]  MD,MRCP [facility]) Follow up PRN

## 2016-08-03 NOTE — Telephone Encounter (Signed)
Gastroenterology Pre-Procedure Review  Request Date: 7/31 Requesting Physician: Dr. Vicente Males  PATIENT REVIEW QUESTIONS: The patient responded to the following health history questions as indicated:    1. Are you having any GI issues? yes (Rectal Bleeding) 2. Do you have a personal history of Polyps? no 3. Do you have a family history of Colon Cancer or Polyps? no 4. Diabetes Mellitus? yes (Type II) 5. Joint replacements in the past 12 months?no 6. Major health problems in the past 3 months?yes (Infectious Diarrhea, Dehydration) 7. Any artificial heart valves, MVP, or defibrillator?no    MEDICATIONS & ALLERGIES:    Patient reports the following regarding taking any anticoagulation/antiplatelet therapy:   Plavix, Coumadin, Eliquis, Xarelto, Lovenox, Pradaxa, Brilinta, or Effient? no Aspirin? no  Patient confirms/reports the following medications:  Current Outpatient Prescriptions  Medication Sig Dispense Refill  . Butalbital-APAP-Caffeine (FIORICET PO) Take 1 tablet by mouth daily.     Marland Kitchen gabapentin (NEURONTIN) 300 MG capsule TAKE 1 CAPSULE (300 MG TOTAL) BY MOUTH 2 (TWO) TIMES DAILY.  2  . hyoscyamine (LEVSIN SL) 0.125 MG SL tablet Place 0.125 mg under the tongue every 4 (four) hours as needed.    Marland Kitchen lisinopril (PRINIVIL,ZESTRIL) 40 MG tablet Take 40 mg by mouth daily.    . metFORMIN (GLUCOPHAGE) 500 MG tablet Take by mouth 2 (two) times daily with a meal.    . metoprolol tartrate (LOPRESSOR) 25 MG tablet Take 25 mg by mouth 2 (two) times daily.    . nortriptyline (PAMELOR) 50 MG capsule Take 50 mg by mouth at bedtime.    . ondansetron (ZOFRAN ODT) 4 MG disintegrating tablet Take 1 tablet (4 mg total) by mouth every 8 (eight) hours as needed for nausea or vomiting. 20 tablet 0  . pantoprazole (PROTONIX) 40 MG tablet Take 40 mg by mouth daily.    . polyethylene glycol powder (GLYCOLAX/MIRALAX) powder TAKE 255 G BY MOUTH ONCE DAILY FOR 1 DAY. TAKE AS DIRECTED FOR COLONOSCOPY.  0  . Probiotic  Product (PROBIOTIC DAILY) CAPS Take 1 capsule by mouth 2 (two) times daily. 90 capsule 0   No current facility-administered medications for this visit.     Patient confirms/reports the following allergies:  Allergies  Allergen Reactions  . Hydrocodone Other (See Comments)    Constipation    No orders of the defined types were placed in this encounter.   AUTHORIZATION INFORMATION Primary Insurance: 1D#: Group #:  Secondary Insurance: 1D#: Group #:  SCHEDULE INFORMATION: Date: 7/31 Time: Location: [place]

## 2016-08-04 ENCOUNTER — Telehealth: Payer: Self-pay | Admitting: Gastroenterology

## 2016-08-04 NOTE — Telephone Encounter (Signed)
08/04/16 NO prior auth required for [facility] A[facility] for  Colonoscopy 20233 / K62.5, A09

## 2016-08-08 ENCOUNTER — Ambulatory Visit: Payer: Medicare Other

## 2016-08-11 ENCOUNTER — Ambulatory Visit: Payer: Medicare Other

## 2016-08-15 ENCOUNTER — Ambulatory Visit: Payer: Medicare Other | Attending: Physical Medicine and Rehabilitation

## 2016-08-15 DIAGNOSIS — M5416 Radiculopathy, lumbar region: Secondary | ICD-10-CM | POA: Insufficient documentation

## 2016-08-15 DIAGNOSIS — M545 Low back pain: Secondary | ICD-10-CM | POA: Diagnosis present

## 2016-08-15 DIAGNOSIS — M5412 Radiculopathy, cervical region: Secondary | ICD-10-CM | POA: Diagnosis present

## 2016-08-15 DIAGNOSIS — M6281 Muscle weakness (generalized): Secondary | ICD-10-CM | POA: Diagnosis present

## 2016-08-15 DIAGNOSIS — R262 Difficulty in walking, not elsewhere classified: Secondary | ICD-10-CM | POA: Insufficient documentation

## 2016-08-15 DIAGNOSIS — G8929 Other chronic pain: Secondary | ICD-10-CM | POA: Diagnosis present

## 2016-08-15 DIAGNOSIS — M542 Cervicalgia: Secondary | ICD-10-CM | POA: Diagnosis present
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
## 2016-08-22 ENCOUNTER — Ambulatory Visit: Payer: Medicare Other

## 2016-08-22 DIAGNOSIS — R262 Difficulty in walking, not elsewhere classified: Secondary | ICD-10-CM

## 2016-08-22 DIAGNOSIS — M5416 Radiculopathy, lumbar region: Secondary | ICD-10-CM

## 2016-08-22 DIAGNOSIS — M6281 Muscle weakness (generalized): Secondary | ICD-10-CM

## 2016-08-22 DIAGNOSIS — M545 Low back pain: Secondary | ICD-10-CM

## 2016-08-22 DIAGNOSIS — G8929 Other chronic pain: Secondary | ICD-10-CM

## 2016-08-22 DIAGNOSIS — M542 Cervicalgia: Secondary | ICD-10-CM
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
## 2016-08-24 ENCOUNTER — Ambulatory Visit: Payer: Medicare Other

## 2016-08-24 DIAGNOSIS — M542 Cervicalgia: Secondary | ICD-10-CM

## 2016-08-24 DIAGNOSIS — M545 Low back pain: Secondary | ICD-10-CM

## 2016-08-24 DIAGNOSIS — M5416 Radiculopathy, lumbar region: Secondary | ICD-10-CM

## 2016-08-24 DIAGNOSIS — G8929 Other chronic pain: Secondary | ICD-10-CM

## 2016-08-24 DIAGNOSIS — R262 Difficulty in walking, not elsewhere classified: Secondary | ICD-10-CM | POA: Diagnosis not present

## 2016-08-24 DIAGNOSIS — M5412 Radiculopathy, cervical region: Secondary | ICD-10-CM

## 2016-08-24 NOTE — Patient Instructions (Signed)
     Scapular Retraction (Standing)   With arms at sides, pinch shoulder blades together.  Repeat __10__ times per set. Do __3__ sets per session at least.     Copyright  VHI. All rights reserved.
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
## 2016-09-05 ENCOUNTER — Ambulatory Visit: Payer: Medicare Other

## 2016-09-05 DIAGNOSIS — M5416 Radiculopathy, lumbar region: Secondary | ICD-10-CM

## 2016-09-05 DIAGNOSIS — R262 Difficulty in walking, not elsewhere classified: Secondary | ICD-10-CM | POA: Diagnosis not present

## 2016-09-05 DIAGNOSIS — M5412 Radiculopathy, cervical region: Secondary | ICD-10-CM

## 2016-09-05 DIAGNOSIS — M6281 Muscle weakness (generalized): Secondary | ICD-10-CM

## 2016-09-05 DIAGNOSIS — M542 Cervicalgia: Secondary | ICD-10-CM

## 2016-09-05 DIAGNOSIS — G8929 Other chronic pain: Secondary | ICD-10-CM

## 2016-09-05 DIAGNOSIS — M545 Low back pain: Secondary | ICD-10-CM
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
## 2016-09-06 ENCOUNTER — Encounter: Payer: Self-pay | Admitting: Anesthesiology

## 2016-09-06 ENCOUNTER — Encounter
Admission: RE | Disposition: A | Discharge status: Home / Self Care | Payer: Self-pay | Source: Ambulatory Visit | Attending: Gastroenterology

## 2016-09-06 ENCOUNTER — Ambulatory Visit
Admission: RE | Admit: 2016-09-06 | Discharge: 2016-09-06 | Disposition: A | Discharge status: Home / Self Care | Payer: Medicare Other | Source: Ambulatory Visit | Attending: Gastroenterology | Admitting: Gastroenterology

## 2016-09-06 DIAGNOSIS — Z5309 Procedure and treatment not carried out because of other contraindication: Secondary | ICD-10-CM | POA: Insufficient documentation

## 2016-09-06 DIAGNOSIS — Z0000 Encounter for general adult medical examination without abnormal findings: Secondary | ICD-10-CM | POA: Diagnosis present

## 2016-09-06 SURGERY — COLONOSCOPY WITH PROPOFOL
Anesthesia: General

## 2016-09-06 MED ORDER — PROPOFOL 500 MG/50ML IV EMUL
INTRAVENOUS | Status: AC
  Filled 2016-09-06: qty 50

## 2016-09-08 ENCOUNTER — Ambulatory Visit: Payer: Medicare Other | Attending: Physical Medicine and Rehabilitation

## 2016-09-08 DIAGNOSIS — M5412 Radiculopathy, cervical region: Secondary | ICD-10-CM | POA: Insufficient documentation

## 2016-09-08 DIAGNOSIS — M6281 Muscle weakness (generalized): Secondary | ICD-10-CM

## 2016-09-08 DIAGNOSIS — M5416 Radiculopathy, lumbar region: Secondary | ICD-10-CM | POA: Diagnosis present

## 2016-09-08 DIAGNOSIS — G8929 Other chronic pain: Secondary | ICD-10-CM | POA: Diagnosis present

## 2016-09-08 DIAGNOSIS — R262 Difficulty in walking, not elsewhere classified: Secondary | ICD-10-CM

## 2016-09-08 DIAGNOSIS — M545 Low back pain: Secondary | ICD-10-CM | POA: Diagnosis present

## 2016-09-08 DIAGNOSIS — M542 Cervicalgia: Secondary | ICD-10-CM | POA: Insufficient documentation
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
## 2016-09-12 ENCOUNTER — Ambulatory Visit: Payer: Medicare Other

## 2016-09-12 DIAGNOSIS — M5412 Radiculopathy, cervical region: Secondary | ICD-10-CM

## 2016-09-12 DIAGNOSIS — G8929 Other chronic pain: Secondary | ICD-10-CM

## 2016-09-12 DIAGNOSIS — M6281 Muscle weakness (generalized): Secondary | ICD-10-CM

## 2016-09-12 DIAGNOSIS — R262 Difficulty in walking, not elsewhere classified: Secondary | ICD-10-CM | POA: Diagnosis not present

## 2016-09-12 DIAGNOSIS — M545 Low back pain: Secondary | ICD-10-CM

## 2016-09-12 DIAGNOSIS — M542 Cervicalgia: Secondary | ICD-10-CM

## 2016-09-12 DIAGNOSIS — M5416 Radiculopathy, lumbar region: Secondary | ICD-10-CM

## 2016-09-12 NOTE — Patient Instructions (Signed)
Pt was recommended to try gentle standing small R trunk rotation as tolerated throughout the day to see how it helps his back. Pt demonstrated and verbalized understanding.
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
## 2016-09-13 ENCOUNTER — Other Ambulatory Visit: Payer: Self-pay

## 2016-09-13 ENCOUNTER — Telehealth: Payer: Self-pay

## 2016-09-13 DIAGNOSIS — R197 Diarrhea, unspecified: Secondary | ICD-10-CM

## 2016-09-13 DIAGNOSIS — K625 Hemorrhage of anus and rectum: Secondary | ICD-10-CM

## 2016-09-13 NOTE — Telephone Encounter (Signed)
Pt has been scheduled for [facility] 11/01/16 Dr. Vicente Males

## 2016-09-15 ENCOUNTER — Ambulatory Visit: Payer: Medicare Other

## 2016-09-15 DIAGNOSIS — M5412 Radiculopathy, cervical region: Secondary | ICD-10-CM

## 2016-09-15 DIAGNOSIS — M542 Cervicalgia: Secondary | ICD-10-CM

## 2016-09-15 DIAGNOSIS — G8929 Other chronic pain: Secondary | ICD-10-CM

## 2016-09-15 DIAGNOSIS — M6281 Muscle weakness (generalized): Secondary | ICD-10-CM

## 2016-09-15 DIAGNOSIS — R262 Difficulty in walking, not elsewhere classified: Secondary | ICD-10-CM | POA: Diagnosis not present

## 2016-09-15 DIAGNOSIS — M545 Low back pain: Secondary | ICD-10-CM

## 2016-09-15 DIAGNOSIS — M5416 Radiculopathy, lumbar region: Secondary | ICD-10-CM
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
## 2016-09-19 ENCOUNTER — Ambulatory Visit: Payer: Medicare Other

## 2016-09-22 ENCOUNTER — Ambulatory Visit: Payer: Medicare Other

## 2016-09-22 DIAGNOSIS — G8929 Other chronic pain: Secondary | ICD-10-CM

## 2016-09-22 DIAGNOSIS — M5416 Radiculopathy, lumbar region: Secondary | ICD-10-CM

## 2016-09-22 DIAGNOSIS — M6281 Muscle weakness (generalized): Secondary | ICD-10-CM

## 2016-09-22 DIAGNOSIS — R262 Difficulty in walking, not elsewhere classified: Secondary | ICD-10-CM

## 2016-09-22 DIAGNOSIS — M545 Low back pain: Secondary | ICD-10-CM

## 2016-09-22 DIAGNOSIS — M542 Cervicalgia: Secondary | ICD-10-CM

## 2016-09-22 DIAGNOSIS — M5412 Radiculopathy, cervical region: Secondary | ICD-10-CM
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
## 2016-09-27 ENCOUNTER — Ambulatory Visit: Payer: Medicare Other

## 2016-09-27 DIAGNOSIS — M542 Cervicalgia: Secondary | ICD-10-CM

## 2016-09-27 DIAGNOSIS — R262 Difficulty in walking, not elsewhere classified: Secondary | ICD-10-CM | POA: Diagnosis not present

## 2016-09-27 DIAGNOSIS — M6281 Muscle weakness (generalized): Secondary | ICD-10-CM
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
## 2016-09-29 ENCOUNTER — Ambulatory Visit: Payer: Medicare Other

## 2016-09-29 DIAGNOSIS — R262 Difficulty in walking, not elsewhere classified: Secondary | ICD-10-CM | POA: Diagnosis not present

## 2016-09-29 DIAGNOSIS — M6281 Muscle weakness (generalized): Secondary | ICD-10-CM

## 2016-09-29 DIAGNOSIS — M542 Cervicalgia: Secondary | ICD-10-CM
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
## 2016-10-03 ENCOUNTER — Ambulatory Visit: Payer: Medicare Other

## 2016-10-06 ENCOUNTER — Ambulatory Visit: Payer: Medicare Other

## 2016-10-06 DIAGNOSIS — M545 Low back pain: Secondary | ICD-10-CM

## 2016-10-06 DIAGNOSIS — G8929 Other chronic pain: Secondary | ICD-10-CM

## 2016-10-06 DIAGNOSIS — M6281 Muscle weakness (generalized): Secondary | ICD-10-CM

## 2016-10-06 DIAGNOSIS — R262 Difficulty in walking, not elsewhere classified: Secondary | ICD-10-CM | POA: Diagnosis not present

## 2016-10-06 DIAGNOSIS — M5412 Radiculopathy, cervical region: Secondary | ICD-10-CM

## 2016-10-06 DIAGNOSIS — M5416 Radiculopathy, lumbar region: Secondary | ICD-10-CM

## 2016-10-06 DIAGNOSIS — M542 Cervicalgia: Secondary | ICD-10-CM
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
## 2016-10-06 NOTE — Patient Instructions (Signed)
Pt was recommended to perform seated cervical nodding at home to help decrease pressure to the greater occipital nerves to hopefully decrease headache. Pt demonstrated and verbalized understanding.

## 2016-10-11 ENCOUNTER — Ambulatory Visit: Payer: Medicare Other | Attending: Physical Medicine and Rehabilitation

## 2016-10-11 DIAGNOSIS — M6281 Muscle weakness (generalized): Secondary | ICD-10-CM | POA: Insufficient documentation

## 2016-10-11 DIAGNOSIS — M542 Cervicalgia: Secondary | ICD-10-CM | POA: Insufficient documentation

## 2016-10-11 DIAGNOSIS — M5416 Radiculopathy, lumbar region: Secondary | ICD-10-CM | POA: Insufficient documentation

## 2016-10-11 DIAGNOSIS — M545 Low back pain: Secondary | ICD-10-CM | POA: Insufficient documentation

## 2016-10-11 DIAGNOSIS — R262 Difficulty in walking, not elsewhere classified: Secondary | ICD-10-CM | POA: Insufficient documentation

## 2016-10-11 DIAGNOSIS — G8929 Other chronic pain: Secondary | ICD-10-CM | POA: Insufficient documentation

## 2016-10-11 DIAGNOSIS — M5412 Radiculopathy, cervical region: Secondary | ICD-10-CM | POA: Insufficient documentation

## 2016-10-13 ENCOUNTER — Ambulatory Visit: Payer: Medicare Other

## 2016-10-13 DIAGNOSIS — M5416 Radiculopathy, lumbar region: Secondary | ICD-10-CM

## 2016-10-13 DIAGNOSIS — G8929 Other chronic pain: Secondary | ICD-10-CM | POA: Diagnosis present

## 2016-10-13 DIAGNOSIS — M5412 Radiculopathy, cervical region: Secondary | ICD-10-CM | POA: Diagnosis present

## 2016-10-13 DIAGNOSIS — M545 Low back pain: Secondary | ICD-10-CM

## 2016-10-13 DIAGNOSIS — R262 Difficulty in walking, not elsewhere classified: Secondary | ICD-10-CM

## 2016-10-13 DIAGNOSIS — M6281 Muscle weakness (generalized): Secondary | ICD-10-CM | POA: Diagnosis present

## 2016-10-13 DIAGNOSIS — M542 Cervicalgia: Secondary | ICD-10-CM | POA: Diagnosis present
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
## 2016-10-17 ENCOUNTER — Ambulatory Visit: Payer: Medicare Other

## 2016-10-20 ENCOUNTER — Telehealth: Payer: Self-pay

## 2016-10-20 ENCOUNTER — Ambulatory Visit: Payer: Medicare Other

## 2016-10-20 DIAGNOSIS — M542 Cervicalgia: Secondary | ICD-10-CM

## 2016-10-20 DIAGNOSIS — R262 Difficulty in walking, not elsewhere classified: Secondary | ICD-10-CM

## 2016-10-20 DIAGNOSIS — M6281 Muscle weakness (generalized): Secondary | ICD-10-CM

## 2016-10-20 DIAGNOSIS — G8929 Other chronic pain: Secondary | ICD-10-CM

## 2016-10-20 DIAGNOSIS — M5412 Radiculopathy, cervical region: Secondary | ICD-10-CM

## 2016-10-20 DIAGNOSIS — M5416 Radiculopathy, lumbar region: Secondary | ICD-10-CM

## 2016-10-20 DIAGNOSIS — M545 Low back pain: Secondary | ICD-10-CM

## 2016-10-20 NOTE — Telephone Encounter (Signed)
No show for Monday 10/17/2016 appointment. Called patient to confirm if he will able to make it to today's (10/20/2016) 10:30 am appointment. Pt confirmed that he can make it.
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
## 2016-10-20 NOTE — Patient Instructions (Addendum)
   Sit on a chair with chair against wall   Towel roll behind low back   Deflated ball comfortably behind head against wall.      Chin tuck with tongue pressed at roof of mouth      Turn your head to the right and left in a comfortable range 5 times each side for 3 sets. Perform 3 times daily.     Nod your head as if you are answering "yes" to a question in a comfortable range 5 times for 3 sets. Perform 3 times daily.      Squeeze your shoulder blades together comfortably. Perform 5 times for 3 sets. Do this 3 times daily.      Gently pull your belly button in to activate your trunk muscles comfortably. Hold for 5 seconds for 3 sets. Perform 3 times daily.

## 2016-10-24 ENCOUNTER — Ambulatory Visit: Payer: Medicare Other

## 2016-10-24 DIAGNOSIS — M545 Low back pain: Secondary | ICD-10-CM

## 2016-10-24 DIAGNOSIS — R262 Difficulty in walking, not elsewhere classified: Secondary | ICD-10-CM

## 2016-10-24 DIAGNOSIS — M542 Cervicalgia: Secondary | ICD-10-CM

## 2016-10-24 DIAGNOSIS — M5416 Radiculopathy, lumbar region: Secondary | ICD-10-CM

## 2016-10-24 DIAGNOSIS — M6281 Muscle weakness (generalized): Secondary | ICD-10-CM

## 2016-10-24 DIAGNOSIS — G8929 Other chronic pain: Secondary | ICD-10-CM

## 2016-10-24 DIAGNOSIS — M5412 Radiculopathy, cervical region: Secondary | ICD-10-CM
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
## 2016-10-25 DIAGNOSIS — G4733 Obstructive sleep apnea (adult) (pediatric): Secondary | ICD-10-CM

## 2016-10-27 ENCOUNTER — Ambulatory Visit: Payer: Medicare Other

## 2016-10-27 DIAGNOSIS — M5412 Radiculopathy, cervical region: Secondary | ICD-10-CM

## 2016-10-27 DIAGNOSIS — M6281 Muscle weakness (generalized): Secondary | ICD-10-CM

## 2016-10-27 DIAGNOSIS — G8929 Other chronic pain: Secondary | ICD-10-CM

## 2016-10-27 DIAGNOSIS — M545 Low back pain: Secondary | ICD-10-CM

## 2016-10-27 DIAGNOSIS — M5416 Radiculopathy, lumbar region: Secondary | ICD-10-CM

## 2016-10-27 DIAGNOSIS — M542 Cervicalgia: Secondary | ICD-10-CM

## 2016-10-27 DIAGNOSIS — R262 Difficulty in walking, not elsewhere classified: Secondary | ICD-10-CM | POA: Diagnosis not present
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
## 2016-11-01 ENCOUNTER — Encounter: Payer: Self-pay | Admitting: *Deleted

## 2016-11-01 ENCOUNTER — Ambulatory Visit
Admission: RE | Admit: 2016-11-01 | Discharge: 2016-11-01 | Disposition: A | Discharge status: Home / Self Care | Payer: Medicare Other | Source: Ambulatory Visit | Attending: Gastroenterology | Admitting: Gastroenterology

## 2016-11-01 ENCOUNTER — Encounter
Admission: RE | Disposition: A | Discharge status: Home / Self Care | Payer: Self-pay | Source: Ambulatory Visit | Attending: Gastroenterology

## 2016-11-01 ENCOUNTER — Ambulatory Visit: Payer: Medicare Other | Admitting: Certified Registered"

## 2016-11-01 DIAGNOSIS — K5730 Diverticulosis of large intestine without perforation or abscess without bleeding: Secondary | ICD-10-CM | POA: Diagnosis not present

## 2016-11-01 DIAGNOSIS — D123 Benign neoplasm of transverse colon: Secondary | ICD-10-CM | POA: Diagnosis not present

## 2016-11-01 DIAGNOSIS — Z87891 Personal history of nicotine dependence: Secondary | ICD-10-CM | POA: Insufficient documentation

## 2016-11-01 DIAGNOSIS — K625 Hemorrhage of anus and rectum: Secondary | ICD-10-CM

## 2016-11-01 DIAGNOSIS — J449 Chronic obstructive pulmonary disease, unspecified: Secondary | ICD-10-CM | POA: Insufficient documentation

## 2016-11-01 DIAGNOSIS — Z7984 Long term (current) use of oral hypoglycemic drugs: Secondary | ICD-10-CM | POA: Insufficient documentation

## 2016-11-01 DIAGNOSIS — F419 Anxiety disorder, unspecified: Secondary | ICD-10-CM | POA: Diagnosis not present

## 2016-11-01 DIAGNOSIS — F329 Major depressive disorder, single episode, unspecified: Secondary | ICD-10-CM | POA: Diagnosis not present

## 2016-11-01 DIAGNOSIS — K640 First degree hemorrhoids: Secondary | ICD-10-CM | POA: Diagnosis not present

## 2016-11-01 DIAGNOSIS — I10 Essential (primary) hypertension: Secondary | ICD-10-CM | POA: Insufficient documentation

## 2016-11-01 DIAGNOSIS — E119 Type 2 diabetes mellitus without complications: Secondary | ICD-10-CM | POA: Diagnosis not present

## 2016-11-01 DIAGNOSIS — G4730 Sleep apnea, unspecified: Secondary | ICD-10-CM | POA: Diagnosis not present

## 2016-11-01 DIAGNOSIS — G8929 Other chronic pain: Secondary | ICD-10-CM | POA: Diagnosis not present

## 2016-11-01 DIAGNOSIS — Z79899 Other long term (current) drug therapy: Secondary | ICD-10-CM | POA: Insufficient documentation

## 2016-11-01 DIAGNOSIS — K219 Gastro-esophageal reflux disease without esophagitis: Secondary | ICD-10-CM | POA: Diagnosis not present

## 2016-11-01 DIAGNOSIS — R197 Diarrhea, unspecified: Secondary | ICD-10-CM

## 2016-11-01 HISTORY — PX: COLONOSCOPY WITH PROPOFOL: SHX5780

## 2016-11-01 HISTORY — DX: Chronic obstructive pulmonary disease, unspecified: J44.9

## 2016-11-01 HISTORY — DX: Sleep apnea, unspecified: G47.30

## 2016-11-01 LAB — GLUCOSE, CAPILLARY: Glucose-Capillary: 110 mg/dL — ABNORMAL HIGH (ref 65–99)

## 2016-11-01 SURGERY — COLONOSCOPY WITH PROPOFOL
Anesthesia: General

## 2016-11-01 MED ORDER — LIDOCAINE HCL (CARDIAC) 20 MG/ML IV SOLN
INTRAVENOUS | Status: DC | PRN
  Administered 2016-11-01: 40 mg via INTRAVENOUS

## 2016-11-01 MED ORDER — PROPOFOL 10 MG/ML IV BOLUS
INTRAVENOUS | Status: DC | PRN
  Administered 2016-11-01: 20 mg via INTRAVENOUS
  Administered 2016-11-01: 80 mg via INTRAVENOUS

## 2016-11-01 MED ORDER — SODIUM CHLORIDE 0.9 % IV SOLN
INTRAVENOUS | Status: DC
  Administered 2016-11-01: 1000 mL via INTRAVENOUS

## 2016-11-01 MED ORDER — LIDOCAINE HCL (PF) 2 % IJ SOLN
INTRAMUSCULAR | Status: AC
  Filled 2016-11-01: qty 2

## 2016-11-01 MED ORDER — PROPOFOL 500 MG/50ML IV EMUL
INTRAVENOUS | Status: DC | PRN
  Administered 2016-11-01: 150 ug/kg/min via INTRAVENOUS

## 2016-11-01 MED ORDER — PROPOFOL 500 MG/50ML IV EMUL
INTRAVENOUS | Status: AC
  Filled 2016-11-01: qty 50

## 2016-11-01 NOTE — Transfer of Care (Signed)
Immediate Anesthesia Transfer of Care Note  Patient: [name]  Procedure(s) Performed: Procedure(s): COLONOSCOPY WITH PROPOFOL (N/A)  Patient Location: PACU  Anesthesia Type:General  Level of Consciousness: sedated and responds to stimulation  Airway & Oxygen Therapy: Patient Spontanous Breathing and Patient connected to nasal cannula oxygen  Post-op Assessment: Report given to RN and Post -op Vital signs reviewed and stable  Post vital signs: Reviewed and stable  Last Vitals:  Vitals:   11/01/16 0840  BP: (!) 149/93  Pulse: 80  Resp: 20  Temp: (!) 36.1 C  SpO2: 100%    Last Pain:  Vitals:   11/01/16 0840  TempSrc: Tympanic  PainSc: 8          Complications: No apparent anesthesia complications

## 2016-11-01 NOTE — Anesthesia Postprocedure Evaluation (Signed)
Anesthesia Post Note  Patient: Zymiere Trostle  Procedure(s) Performed: Procedure(s) (LRB): COLONOSCOPY WITH PROPOFOL (N/A)  Patient location during evaluation: Endoscopy Anesthesia Type: General Level of consciousness: awake and alert and oriented Pain management: pain level controlled Vital Signs Assessment: post-procedure vital signs reviewed and stable Respiratory status: spontaneous breathing, nonlabored ventilation and respiratory function stable Cardiovascular status: blood pressure returned to baseline and stable Postop Assessment: no signs of nausea or vomiting Anesthetic complications: no     Last Vitals:  Vitals:   11/01/16 1015 11/01/16 1024  BP: 105/73 101/75  Pulse: 82 78  Resp: 17 15  Temp:    SpO2: 99% 100%    Last Pain:  Vitals:   11/01/16 1014  TempSrc: Tympanic  PainSc:                  Kaelea Gathright

## 2016-11-01 NOTE — Anesthesia Preprocedure Evaluation (Signed)
Anesthesia Evaluation  Patient identified by MRN, date of birth, ID band Patient awake    Reviewed: Allergy & Precautions, NPO status , Patient's Chart, lab work & pertinent test results  History of Anesthesia Complications Negative for: history of anesthetic complications  Airway Mallampati: II  TM Distance: >3 FB Neck ROM: Full    Dental no notable dental hx.    Pulmonary sleep apnea and Continuous Positive Airway Pressure Ventilation , COPD,  COPD inhaler, former smoker,    breath sounds clear to auscultation- rhonchi (-) wheezing      Cardiovascular hypertension, Pt. on medications (-) CAD, (-) Past MI and (-) Cardiac Stents  Rhythm:Regular Rate:Normal - Systolic murmurs and - Diastolic murmurs    Neuro/Psych  Headaches, PSYCHIATRIC DISORDERS Anxiety Depression    GI/Hepatic Neg liver ROS, GERD  ,  Endo/Other  diabetes, Oral Hypoglycemic Agents  Renal/GU negative Renal ROS     Musculoskeletal negative musculoskeletal ROS (+)   Abdominal (+) - obese,   Peds  Hematology negative hematology ROS (+)   Anesthesia Other Findings Past Medical History: No date: Anxiety No date: Chronic back pain No date: Chronic neck pain No date: COPD (chronic obstructive pulmonary disease) (HCC) No date: Depression No date: Diabetes mellitus without complication (H[facility]) No date: Enlarged heart     Comment:  pt report Left: Enlarged kidney No date: GERD (gastroesophageal reflux disease) No date: Headache, migraine No date: Hypertension No date: Sleep apnea   Reproductive/Obstetrics                             Anesthesia Physical Anesthesia Plan  ASA: III  Anesthesia Plan: General   Post-op Pain Management:    Induction: Intravenous  PONV Risk Score and Plan: 1 and Propofol infusion  Airway Management Planned: Natural Airway  Additional Equipment:   Intra-op Plan:   Post-operative Plan:    Informed Consent: I have reviewed the patients History and Physical, chart, labs and discussed the procedure including the risks, benefits and alternatives for the proposed anesthesia with the patient or authorized representative who has indicated his/her understanding and acceptance.   Dental advisory given  Plan Discussed with: CRNA and Anesthesiologist  Anesthesia Plan Comments:         Anesthesia Quick Evaluation

## 2016-11-01 NOTE — Anesthesia Post-op Follow-up Note (Signed)
Anesthesia QCDR form completed.        

## 2016-11-01 NOTE — H&P (Signed)
[name] MD [address], [place], [place] [address] Phone: [phone] Fax : [phone]  Primary Care Physician:  Theotis Burrow, MD Primary Gastroenterologist:  Dr. [name]   Pre-Procedure History & Physical: HPI:  [name] is a 46 y.o. male is here for an colonoscopy.   Past Medical History:  Diagnosis Date  . Anxiety   . Chronic back pain   . Chronic neck pain   . COPD (chronic obstructive pulmonary disease) ([place])   . Depression   . Diabetes mellitus without complication ([place])   . Enlarged heart    pt report  . Enlarged kidney Left  . GERD (gastroesophageal reflux disease)   . Headache, migraine   . Hypertension   . Sleep apnea     Past Surgical History:  Procedure Laterality Date  . CIRCUMCISION      Prior to Admission medications   Medication Sig Start Date End Date Taking? Authorizing Provider  albuterol (PROVENTIL HFA;VENTOLIN HFA) 108 (90 Base) MCG/ACT inhaler Inhale into the lungs every 6 (six) hours as needed for wheezing or shortness of breath.   Yes [provider]  beclomethasone (QVAR) 80 MCG/ACT inhaler Inhale into the lungs 2 (two) times daily.   Yes [provider]  metoprolol tartrate (LOPRESSOR) 25 MG tablet Take 25 mg by mouth 2 (two) times daily.   Yes [provider]  pantoprazole (PROTONIX) 40 MG tablet Take 40 mg by mouth daily.   Yes [provider]  hyoscyamine (LEVSIN SL) 0.125 MG SL tablet Place 0.125 mg under the tongue every 4 (four) hours as needed.    [provider]  lisinopril (PRINIVIL,ZESTRIL) 40 MG tablet Take 40 mg by mouth daily.    [provider]  metFORMIN (GLUCOPHAGE) 500 MG tablet Take by mouth 2 (two) times daily with a meal.    [provider]  nortriptyline (PAMELOR) 50 MG capsule Take 50 mg by mouth at bedtime.    [provider]  ondansetron (ZOFRAN ODT) 4 MG disintegrating tablet Take 1 tablet (4 mg total) by mouth every  8 (eight) hours as needed for nausea or vomiting. 05/22/16   Harvest Dark, MD  polyethylene glycol powder (GLYCOLAX/MIRALAX) powder TAKE 255 G BY MOUTH ONCE DAILY FOR 1 DAY. TAKE AS DIRECTED FOR COLONOSCOPY. 06/13/16   [provider]    Allergies as of 09/13/2016 - Review Complete 09/08/2016  Allergen Reaction Noted  . Hydrocodone Other (See Comments) 06/13/2016    Family History  Problem Relation Age of Onset  . Diabetes Mother   . Hypertension Mother   . Hypertension Father     Social History   Social History  . Marital status: Married    Spouse name: N/A  . Number of children: N/A  . Years of education: N/A   Occupational History  . Not on file.   Social History Main Topics  . Smoking status: Former Research scientist (life sciences)  . Smokeless tobacco: Former Systems developer  . Alcohol use No     Comment: rare  . Drug use: No  . Sexual activity: Not on file   Other Topics Concern  . Not on file   Social History Narrative  . No narrative on file    Review of Systems: See HPI, otherwise negative ROS  Physical Exam: There were no vitals taken for this visit. General:   Alert,  pleasant and cooperative in NAD Head:  Normocephalic and atraumatic. Neck:  Supple; no masses or thyromegaly. Lungs:  Clear throughout  to auscultation.    Heart:  Regular rate and rhythm. Abdomen:  Soft, nontender and nondistended. Normal bowel sounds, without guarding, and without rebound.   Neurologic:  Alert and  oriented x4;  grossly normal neurologically.  Impression/Plan: [name] is here for an colonoscopy to be performed for rectal bleeding  Risks, benefits, limitations, and alternatives regarding  colonoscopy have been reviewed with the patient.  Questions have been answered.  All parties agreeable.   [name], MD  11/01/2016, 8:41 AM

## 2016-11-01 NOTE — Anesthesia Procedure Notes (Signed)
Performed by: [name] Pre-anesthesia Checklist: Patient identified, Emergency Drugs available, Suction available, Patient being monitored and Timeout performed Patient Re-evaluated:Patient Re-evaluated prior to induction Oxygen Delivery Method: Nasal cannula Induction Type: IV induction       

## 2016-11-01 NOTE — Op Note (Signed)
[facility] Gastroenterology Patient Name: [name] Procedure Date: 11/01/2016 9:49 AM MRN: [id number] Account #: [id number] Date of Birth: Jan 06, 1971 Admit Type: Outpatient Age: 46 Room: [facility] ENDO ROOM 1 Gender: Male Note Status: Finalized Procedure:            Colonoscopy Indications:          Rectal bleeding Providers:            [name] MD, MD Referring MD:         Dyke Maes. Mancheno Revelo (Referring MD) Medicines:            Monitored Anesthesia Care Complications:        No immediate complications. Procedure:            Pre-Anesthesia Assessment:                       - Prior to the procedure, a History and Physical was                        performed, and patient medications, allergies and                        sensitivities were reviewed. The patient's tolerance of                        previous anesthesia was reviewed.                       - The risks and benefits of the procedure and the                        sedation options and risks were discussed with the                        patient. All questions were answered and informed                        consent was obtained.                       - ASA Grade Assessment: III - A patient with severe                        systemic disease.                       After obtaining informed consent, the colonoscope was                        passed under direct vision. Throughout the procedure,                        the patient's blood pressure, pulse, and oxygen                        saturations were monitored continuously. The                        Colonoscope was introduced through the anus and                        advanced  to the the cecum, identified by the                        appendiceal orifice, IC valve and transillumination.                        The colonoscopy was performed with ease. The patient                        tolerated the procedure well. The quality of the bowel                      preparation was good. Findings:      The perianal and digital rectal examinations were normal.      Non-bleeding internal hemorrhoids were found during retroflexion. The       hemorrhoids were medium-sized and Grade I (internal hemorrhoids that do       not prolapse).      A 3 mm polyp was found in the splenic flexure. The polyp was sessile.       The polyp was removed with a cold biopsy forceps. Resection and       retrieval were complete.      A few small-mouthed diverticula were found in the sigmoid colon. Impression:           - Non-bleeding internal hemorrhoids.                       - One 3 mm polyp at the splenic flexure, removed with a                        cold biopsy forceps. Resected and retrieved.                       - Diverticulosis in the sigmoid colon. Recommendation:       - Discharge patient to home (with escort).                       - Resume previous diet.                       - Continue present medications.                       - Await pathology results.                       - Repeat colonoscopy in 5-10 years for surveillance                        based on pathology results. Procedure Code(s):    --- Professional ---                       [phone], Colonoscopy, flexible; with biopsy, single or                        multiple Diagnosis Code(s):    --- Professional ---                       D12.3, Benign neoplasm of transverse colon (hepatic  flexure or splenic flexure)                       K64.0, First degree hemorrhoids                       K62.5, Hemorrhage of anus and rectum                       K57.30, Diverticulosis of large intestine without                        perforation or abscess without bleeding CPT copyright 2016 American Medical Association. All rights reserved. The codes documented in this report are preliminary and upon coder review may  be revised to meet current compliance requirements. [name], MD [name] MD, MD 11/01/2016 10:12:24 AM This report has been signed electronically. Number of Addenda: 0 Note Initiated On: 11/01/2016 9:49 AM Scope Withdrawal Time: 0 hours 12 minutes 46 seconds  Total Procedure Duration: 0 hours 15 minutes 36 seconds       [facility]

## 2016-11-02 ENCOUNTER — Ambulatory Visit: Payer: Medicare Other

## 2016-11-02 ENCOUNTER — Encounter: Payer: Self-pay | Admitting: Gastroenterology

## 2016-11-03 LAB — SURGICAL PATHOLOGY

## 2016-11-07 ENCOUNTER — Encounter: Payer: Self-pay | Admitting: Gastroenterology

## 2016-11-07 ENCOUNTER — Ambulatory Visit: Payer: Medicare Other

## 2016-11-08 ENCOUNTER — Ambulatory Visit: Payer: Medicare Other

## 2016-11-15 ENCOUNTER — Ambulatory Visit: Payer: Medicare Other | Attending: Physical Medicine and Rehabilitation

## 2016-11-15 ENCOUNTER — Telehealth: Payer: Self-pay

## 2016-11-15 DIAGNOSIS — M542 Cervicalgia: Secondary | ICD-10-CM | POA: Insufficient documentation

## 2016-11-15 DIAGNOSIS — M5412 Radiculopathy, cervical region: Secondary | ICD-10-CM | POA: Insufficient documentation

## 2016-11-15 DIAGNOSIS — R262 Difficulty in walking, not elsewhere classified: Secondary | ICD-10-CM | POA: Insufficient documentation

## 2016-11-15 DIAGNOSIS — M6281 Muscle weakness (generalized): Secondary | ICD-10-CM | POA: Insufficient documentation

## 2016-11-15 DIAGNOSIS — M545 Low back pain: Secondary | ICD-10-CM | POA: Insufficient documentation

## 2016-11-15 DIAGNOSIS — G8929 Other chronic pain: Secondary | ICD-10-CM | POA: Insufficient documentation

## 2016-11-15 DIAGNOSIS — M5416 Radiculopathy, lumbar region: Secondary | ICD-10-CM | POA: Insufficient documentation

## 2016-11-15 NOTE — Telephone Encounter (Signed)
No show. Called both mobile phone and home phone numbers provided. Unable to leave a message or talk to patient.

## 2016-11-21 ENCOUNTER — Ambulatory Visit: Payer: Medicare Other

## 2016-11-21 DIAGNOSIS — M5416 Radiculopathy, lumbar region: Secondary | ICD-10-CM

## 2016-11-21 DIAGNOSIS — G8929 Other chronic pain: Secondary | ICD-10-CM

## 2016-11-21 DIAGNOSIS — M545 Low back pain: Secondary | ICD-10-CM

## 2016-11-21 DIAGNOSIS — R262 Difficulty in walking, not elsewhere classified: Secondary | ICD-10-CM

## 2016-11-21 DIAGNOSIS — M5412 Radiculopathy, cervical region: Secondary | ICD-10-CM | POA: Diagnosis present

## 2016-11-21 DIAGNOSIS — M6281 Muscle weakness (generalized): Secondary | ICD-10-CM

## 2016-11-21 DIAGNOSIS — M542 Cervicalgia: Secondary | ICD-10-CM | POA: Diagnosis present
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
## 2016-11-24 ENCOUNTER — Ambulatory Visit: Payer: Medicare Other

## 2016-11-24 DIAGNOSIS — M5416 Radiculopathy, lumbar region: Secondary | ICD-10-CM

## 2016-11-24 DIAGNOSIS — M5412 Radiculopathy, cervical region: Secondary | ICD-10-CM

## 2016-11-24 DIAGNOSIS — M542 Cervicalgia: Secondary | ICD-10-CM

## 2016-11-24 DIAGNOSIS — G8929 Other chronic pain: Secondary | ICD-10-CM

## 2016-11-24 DIAGNOSIS — M545 Low back pain: Secondary | ICD-10-CM

## 2016-11-24 DIAGNOSIS — M6281 Muscle weakness (generalized): Secondary | ICD-10-CM

## 2016-11-24 DIAGNOSIS — R262 Difficulty in walking, not elsewhere classified: Secondary | ICD-10-CM
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
## 2016-11-24 NOTE — Patient Instructions (Addendum)
  Seated press-ups isometrics  Sitting down on your chair with arm rest (towel roll behind your low back)   Gently press your forearms on your arm rest   Perform 10 times   Do 3 sets daily.     Also tighten your abdominal muscles (no holds) throughout the day, but as much as you can both in standing and sitting with a towel roll in your low back.    Seated gentle extensions  Sitting on a chair with a comfortable towel roll at your low back     Gently lean back and count out loud for 5 seconds.   Repeat 10 times.   Perform 3 sets daily at least.    Take rest breaks as needed.

## 2016-11-28 ENCOUNTER — Ambulatory Visit: Payer: Medicare Other

## 2016-11-28 DIAGNOSIS — R262 Difficulty in walking, not elsewhere classified: Secondary | ICD-10-CM

## 2016-11-28 DIAGNOSIS — M5416 Radiculopathy, lumbar region: Secondary | ICD-10-CM

## 2016-11-28 DIAGNOSIS — M6281 Muscle weakness (generalized): Secondary | ICD-10-CM

## 2016-11-28 DIAGNOSIS — G8929 Other chronic pain: Secondary | ICD-10-CM

## 2016-11-28 DIAGNOSIS — M542 Cervicalgia: Secondary | ICD-10-CM

## 2016-11-28 DIAGNOSIS — M545 Low back pain: Secondary | ICD-10-CM

## 2016-11-28 DIAGNOSIS — M5412 Radiculopathy, cervical region: Secondary | ICD-10-CM
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
## 2016-12-01 ENCOUNTER — Ambulatory Visit: Payer: Medicare Other

## 2016-12-05 ENCOUNTER — Ambulatory Visit: Payer: Medicare Other

## 2016-12-05 DIAGNOSIS — M5412 Radiculopathy, cervical region: Secondary | ICD-10-CM

## 2016-12-05 DIAGNOSIS — M6281 Muscle weakness (generalized): Secondary | ICD-10-CM

## 2016-12-05 DIAGNOSIS — M542 Cervicalgia: Secondary | ICD-10-CM

## 2016-12-05 DIAGNOSIS — M5416 Radiculopathy, lumbar region: Secondary | ICD-10-CM

## 2016-12-05 DIAGNOSIS — R262 Difficulty in walking, not elsewhere classified: Secondary | ICD-10-CM

## 2016-12-05 DIAGNOSIS — G8929 Other chronic pain: Secondary | ICD-10-CM

## 2016-12-05 DIAGNOSIS — M545 Low back pain: Secondary | ICD-10-CM
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
## 2016-12-08 ENCOUNTER — Ambulatory Visit: Payer: Medicare Other | Attending: Physical Medicine and Rehabilitation

## 2016-12-08 DIAGNOSIS — M542 Cervicalgia: Secondary | ICD-10-CM

## 2016-12-08 DIAGNOSIS — R262 Difficulty in walking, not elsewhere classified: Secondary | ICD-10-CM

## 2016-12-08 DIAGNOSIS — M6281 Muscle weakness (generalized): Secondary | ICD-10-CM

## 2016-12-08 DIAGNOSIS — G8929 Other chronic pain: Secondary | ICD-10-CM | POA: Diagnosis present

## 2016-12-08 DIAGNOSIS — M545 Low back pain: Secondary | ICD-10-CM | POA: Insufficient documentation

## 2016-12-08 DIAGNOSIS — M5416 Radiculopathy, lumbar region: Secondary | ICD-10-CM

## 2016-12-08 DIAGNOSIS — M5412 Radiculopathy, cervical region: Secondary | ICD-10-CM | POA: Insufficient documentation
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
## 2016-12-08 NOTE — Patient Instructions (Addendum)
Sitting   Tilt your head to the left to feel a stretch at the right side of your neck   Hold for 5 seconds   Repeat 5 times   Perform 3 sets   Do 3 sessions daily.       Sitting   Press your hands on your thighs to feel a gentle activation of your trunk muscles   Hold for 2-5 seconds   Repeat 10 times    Perform 3 sets    Do 3 sessions daily.      Pt was recommended to use a lumbar towel roll to be placed at the most comfortable area of his back (and comfortable thickness) for 2 minutes at a time daily for back pain when sitting. Pt verbalized understanding.     Pt was also recommended pt to perform gentle pain free chin tucks throughout the day as well as to maintain the upright back posture comfortably throughout the day. Pt demonstrated and verbalized understanding.    Sitting on an upright chair with a towel roll at your low back:              Gently press your right heel onto the floor for 2 seconds.               Gently relax.               Repeat 5 times for 3 sets daily.               Perform for your left heel as well.        Hold tubing in right hand, arm out. Pull arm to your side. Do not twist or rotate trunk.    Repeat ___5_ times per set. Do 5__ sets per session. Do _1___ sessions per day.  http://orth.exer.us/834   Copyright  VHI. All rights reserved.       Gently press your tongue at the roof of your mouth throughout the day.        PELVIC TILT: Posterior    Gently flatten low back, then tilt forward to gently extend low back (rock your pelvis back and forth)    _10__ reps per set, __3_ sets per day   Copyright  VHI. All rights reserved.        Sitting on a chair:               Gently sit up straight, then rest               Perform 10 times for 3 sets daily    Always use a comfortable lumbar towel roll or back support whenever you are sitting such as at your car or couch to  help you maintain the upright position.         Scapular Retraction (Standing)   With arms at sides, pinch shoulder blades together.  Repeat __10__ times per set. Do __3__ sets per session at least.     Copyright  VHI. All rights reserved.      Sit on a chair with chair against wall              Towel roll behind low back              Deflated ball comfortably behind head against wall.                            Chin tuck with tongue  pressed at roof of mouth                                       Turn your head to the right and left in a comfortable range 5 times each side for 3 sets. Perform 3 times daily.                                      Nod your head as if you are answering "yes" to a question in a comfortable range 5 times for 3 sets. Perform 3 times daily.                                       Squeeze your shoulder blades together comfortably. Perform 5 times for 3 sets. Do this 3 times daily.                            Gently pull your belly button in to activate your trunk muscles comfortably. Hold for 5 seconds for 3 sets. Perform 3 times daily.     Seated press-ups isometrics  Sitting down on your chair with arm rest (towel roll behind your low back)              Gently press your forearms on your arm rest              Perform 10 times              Do 3 sets daily.     Also tighten your abdominal muscles (no holds) throughout the day, but as much as you can both in standing and sitting with a towel roll in your low back.    Seated gentle extensions  Sitting on a chair with a comfortable towel roll at your low back                           Gently lean back and count out loud for 5 seconds.              Repeat 10 times.              Perform 3 sets daily at least.               Take rest breaks as needed.

## 2016-12-12 ENCOUNTER — Ambulatory Visit: Payer: Medicare Other

## 2017-01-24 ENCOUNTER — Other Ambulatory Visit: Payer: Self-pay | Admitting: Neurology

## 2017-01-24 DIAGNOSIS — R200 Anesthesia of skin: Secondary | ICD-10-CM

## 2017-01-24 DIAGNOSIS — R29898 Other symptoms and signs involving the musculoskeletal system: Secondary | ICD-10-CM | POA: Insufficient documentation

## 2017-01-27 ENCOUNTER — Ambulatory Visit: Payer: Medicare Other | Attending: Neurology

## 2017-01-27 DIAGNOSIS — G4733 Obstructive sleep apnea (adult) (pediatric): Secondary | ICD-10-CM | POA: Insufficient documentation

## 2017-02-02 ENCOUNTER — Ambulatory Visit
Admission: RE | Admit: 2017-02-02 | Discharge: 2017-02-02 | Disposition: A | Discharge status: Home / Self Care | Payer: Medicare Other | Source: Ambulatory Visit | Attending: Neurology | Admitting: Neurology

## 2017-02-02 DIAGNOSIS — G8929 Other chronic pain: Secondary | ICD-10-CM | POA: Diagnosis not present

## 2017-02-02 DIAGNOSIS — R29898 Other symptoms and signs involving the musculoskeletal system: Secondary | ICD-10-CM | POA: Diagnosis not present

## 2017-02-02 DIAGNOSIS — M5136 Other intervertebral disc degeneration, lumbar region: Secondary | ICD-10-CM | POA: Insufficient documentation

## 2017-02-02 DIAGNOSIS — M545 Low back pain: Secondary | ICD-10-CM | POA: Insufficient documentation

## 2017-02-02 DIAGNOSIS — M5137 Other intervertebral disc degeneration, lumbosacral region: Secondary | ICD-10-CM | POA: Diagnosis not present

## 2017-02-02 DIAGNOSIS — R202 Paresthesia of skin: Secondary | ICD-10-CM | POA: Diagnosis present

## 2017-02-02 DIAGNOSIS — R200 Anesthesia of skin: Secondary | ICD-10-CM | POA: Insufficient documentation

## 2018-03-20 IMAGING — CT CT HEAD W/O CM
3 series · 15 of 47 positions shown, 18 images · non-contrast
Comparison: 08/26/2015

CLINICAL DATA: Headache, nausea

EXAM:
CT HEAD WITHOUT CONTRAST
TECHNIQUE: Contiguous axial images were obtained from the base of the skull
through the vertex without intravenous contrast.

[Series 2: head wo · axial · 0.44mm/px · z∈[-37,+88]mm · 9 of 31 slices shown, 12 images]
[im 3/31  brain]
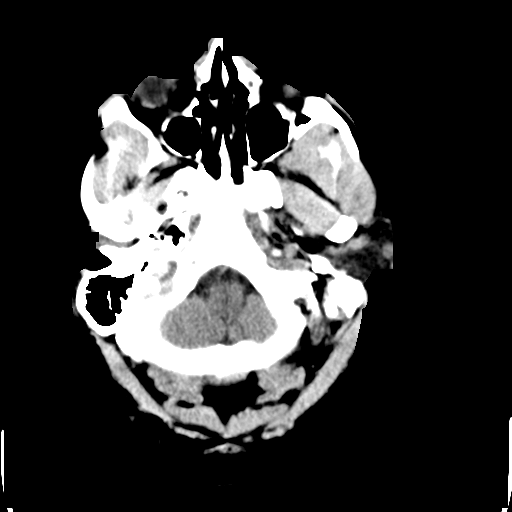
[im 3/31  bone]
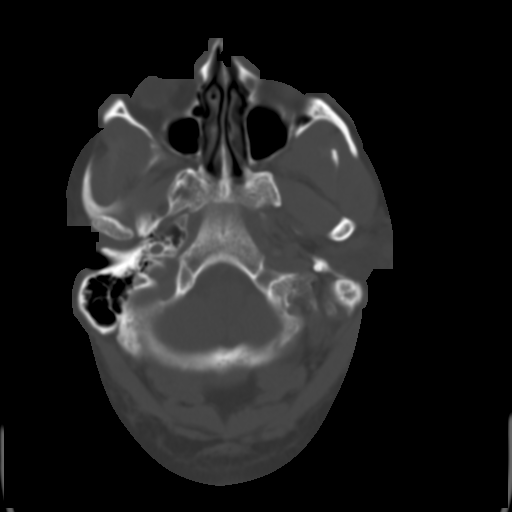
[im 6/31  brain]
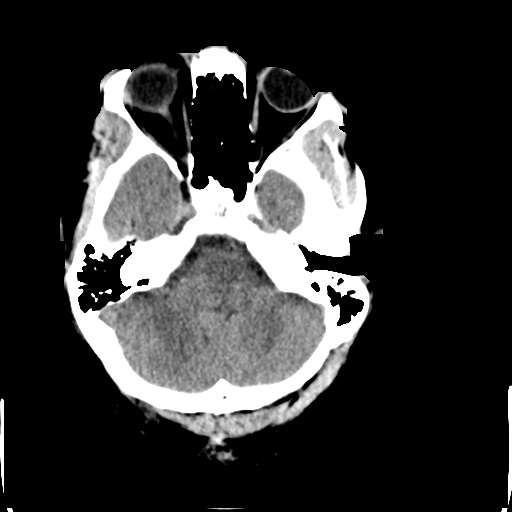
[im 9/31  brain]
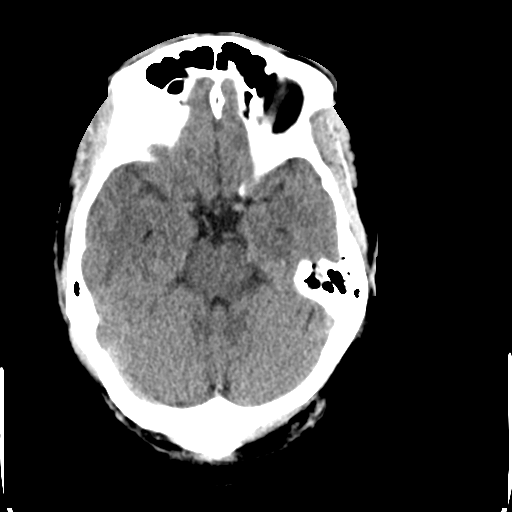
[im 12/31  brain]
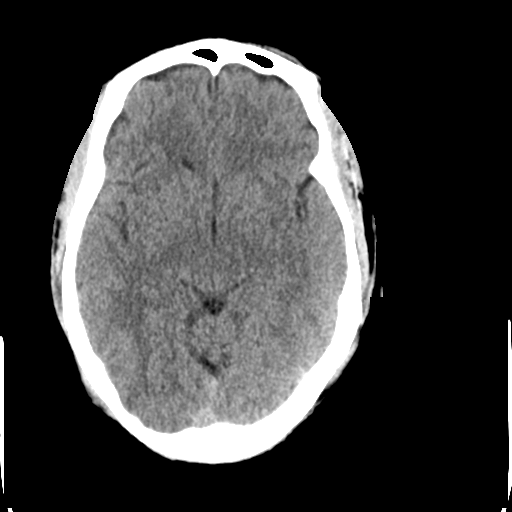
[im 16/31  brain]
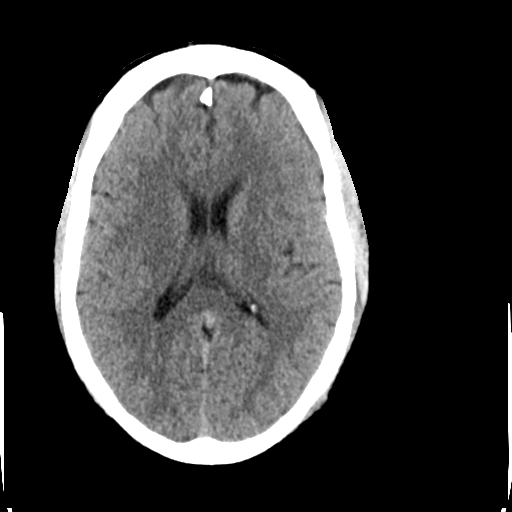
[im 16/31  bone]
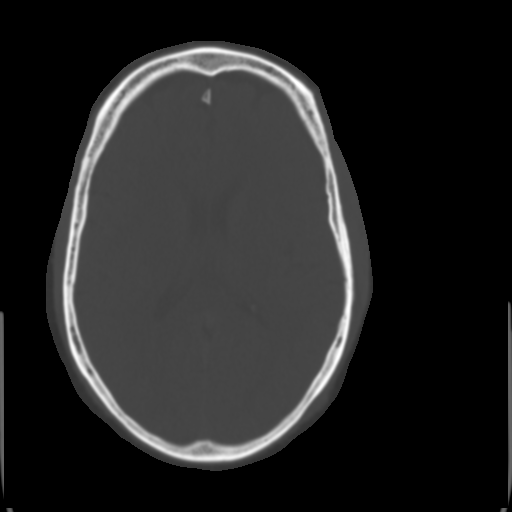
[im 19/31  brain]
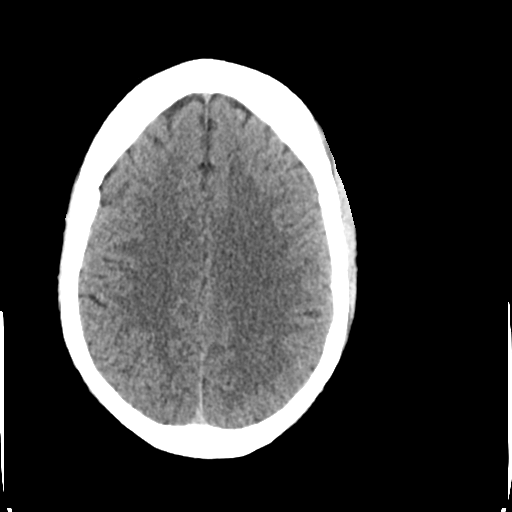
[im 22/31  brain]
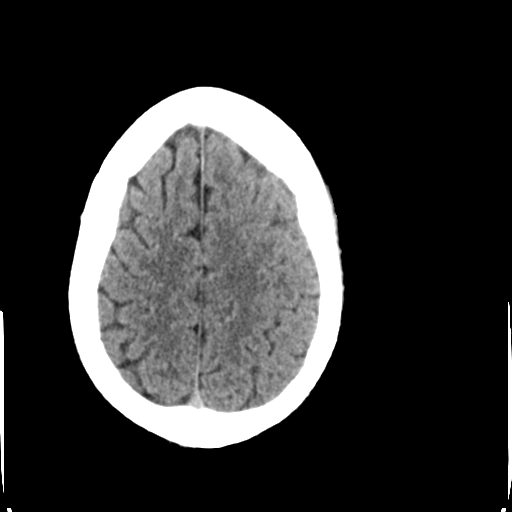
[im 25/31  brain]
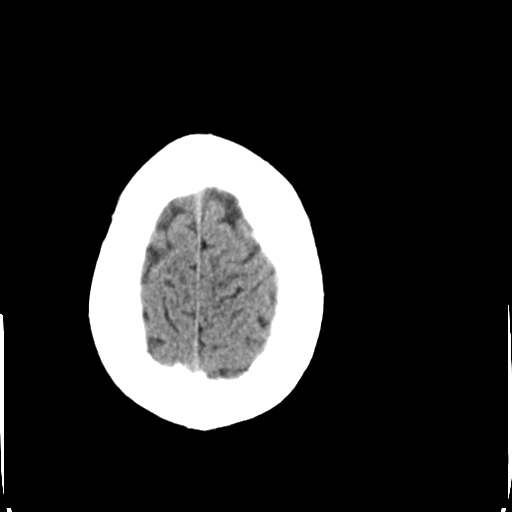
[im 28/31  brain]
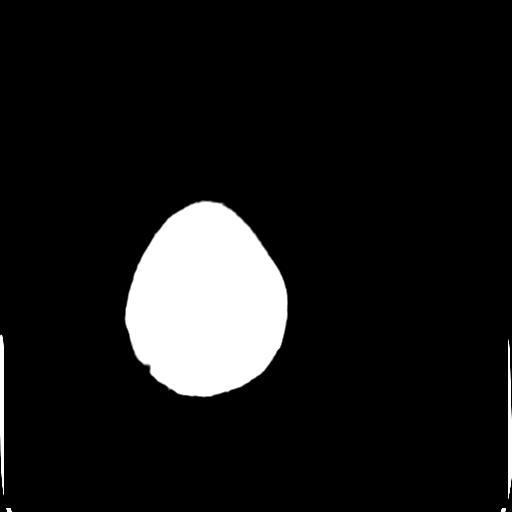
[im 28/31  bone]
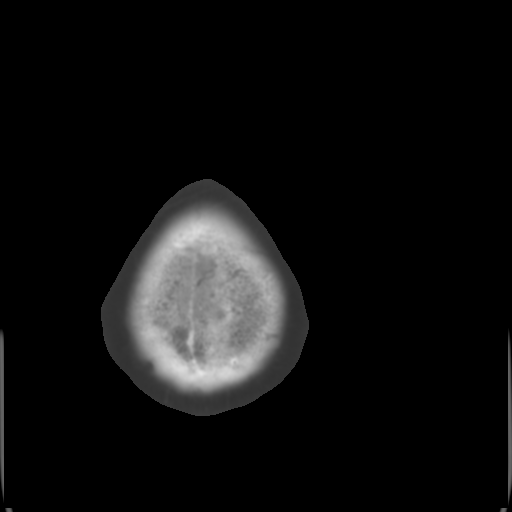

[Series 4: coronal soft tissue · coronal · 0.34mm/px · 3 of 74 slices shown]
[im 25/74  brain]
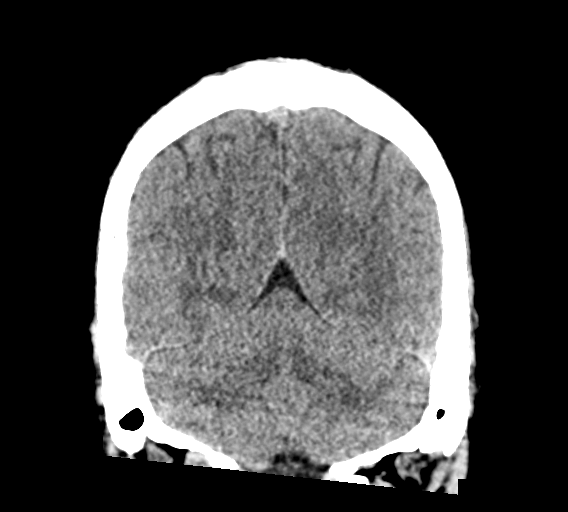
[im 33/74  brain]
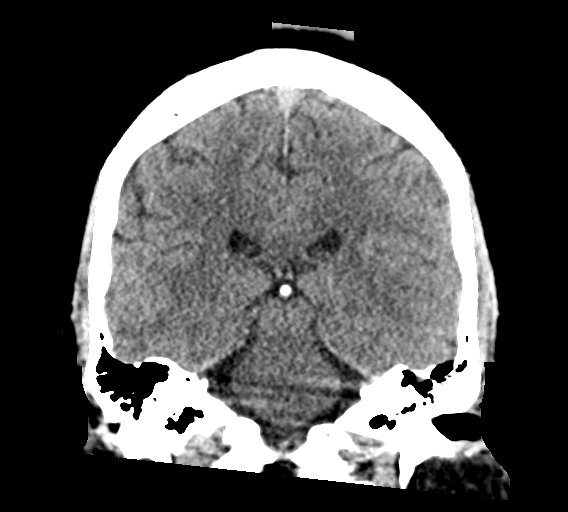
[im 41/74  brain]
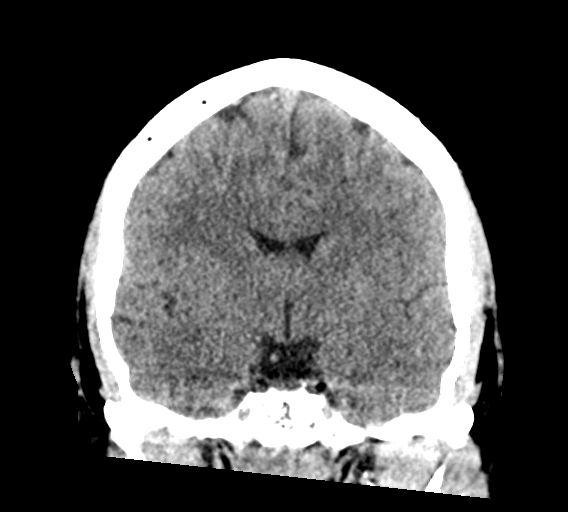

[Series 5: sagittal soft tissue · sagittal · 0.35mm/px · 3 of 67 slices shown]
[im 23/67  brain]
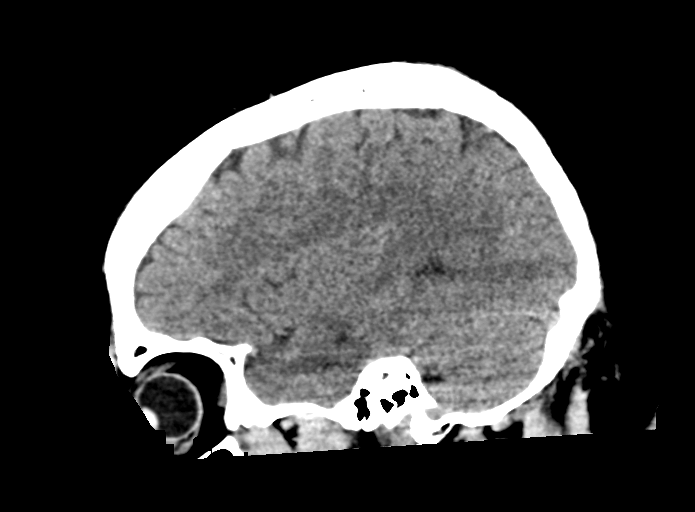
[im 34/67  brain]
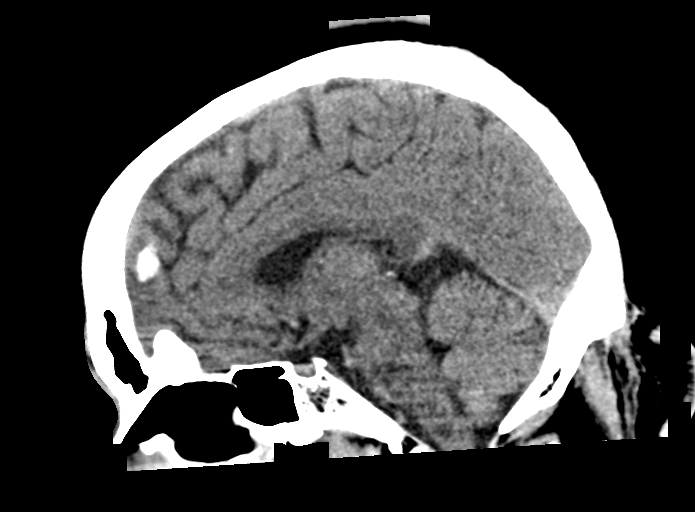
[im 45/67  brain]
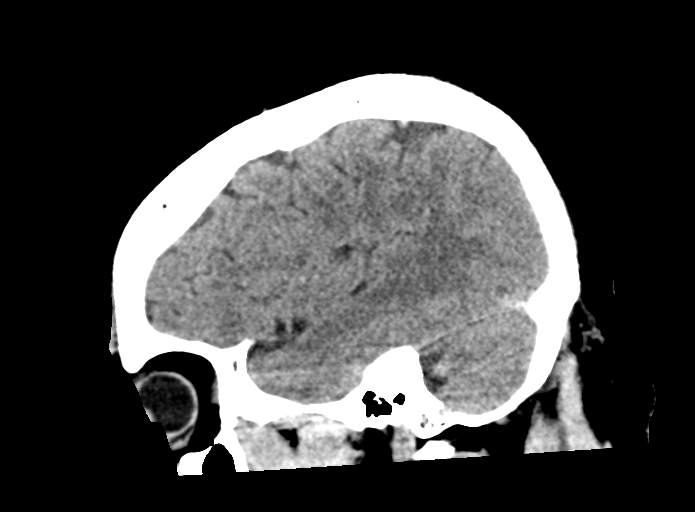

[15 of 47 positions shown; findings below may reference images not displayed]

FINDINGS: Brain: No intracranial hemorrhage, mass effect or midline shift. No
acute cortical infarction. No mass lesion is noted on this
unenhanced scan. The gray and white-matter differentiation is
preserved.

Vascular: No hyperdense vessel or unexpected calcification.

Skull: Normal. Negative for fracture or focal lesion.

Sinuses/Orbits: No acute finding. Again noted chronic partial
opacification of left mastoid air cells without change from prior
exam.

Other: None
IMPRESSION: No acute intracranial abnormality. No definite acute cortical
infarction. No significant change.

## 2018-09-03 ENCOUNTER — Telehealth: Payer: Self-pay

## 2018-09-03 NOTE — Telephone Encounter (Signed)
Received a referral to schedule patient for a colonoscopy, however his last colonoscopy was done 2 years ago and results letter said repeat in 10 years. Patient now experiencing rectal bleeding on and off.  He said he last experienced it on Sunday.  It was not bright red bleeding, no abdominal pain present.  Appt has been scheduled with Dr. Vicente Males.  Thanks [employer]

## 2018-09-06 ENCOUNTER — Other Ambulatory Visit: Payer: Self-pay

## 2018-09-06 ENCOUNTER — Ambulatory Visit (INDEPENDENT_AMBULATORY_CARE_PROVIDER_SITE_OTHER): Payer: Medicare Other | Admitting: Gastroenterology

## 2018-09-06 ENCOUNTER — Encounter: Payer: Self-pay | Admitting: Gastroenterology

## 2018-09-06 VITALS — BP 124/82 | HR 92 | Temp 98.8°F | Ht 68.0 in | Wt 188.4 lb

## 2018-09-06 DIAGNOSIS — K625 Hemorrhage of anus and rectum: Secondary | ICD-10-CM

## 2018-09-06 NOTE — Progress Notes (Signed)
Steven Bellows MD, MRCP(U.K) [address]  [place]  [place], [place] [address]  Main: [phone]  Fax: [phone]   Gastroenterology Consultation  Referring Provider:     Theotis Long* Primary Care Physician:  Steven Burrow, MD Primary Gastroenterologist:  Dr. Jonathon Long  Reason for Consultation:     Rectal bleeding         HPI:   [name] is a 48 y.o. y/o male referred for consultation & management  by Dr. Alene Long, Steven Jarvis, MD.    He was last seen by me over 2 years back. Admitted in He was admitted on 07/23/16 with bloody diarrhea,his stool tested positive for Campylobacter and Ecoli ETEC.Treated with azithromycin. At that time he did have some intermittent rectal bleeding and colonoscopy in  10/2016 showed medium sized internal hemorrids .  He says that he did well after his colonoscopy and rather overall his abdominal pain completely disappeared which he had at that point of time.  He has had on and off rectal bleeding associated with passage of soft stool sometimes associated with clots describes it as dark red in color.  Denies any weight loss.  Denies any perianal itching.  Denies any passage of hard stool.  He has a bowel movement every day.  Past Medical History:  Diagnosis Date  . Anxiety   . Chronic back pain   . Chronic neck pain   . COPD (chronic obstructive pulmonary disease) ([place])   . Depression   . Diabetes mellitus without complication ([place])   . Enlarged heart    pt report  . Enlarged kidney Left  . GERD (gastroesophageal reflux disease)   . Headache, migraine   . Hypertension   . Sleep apnea     Past Surgical History:  Procedure Laterality Date  . CIRCUMCISION    . COLONOSCOPY WITH PROPOFOL N/A 11/01/2016   Procedure: COLONOSCOPY WITH PROPOFOL;  Surgeon: Steven Bellows, MD;  Location: [facility] ENDOSCOPY;  Service: Gastroenterology;  Laterality: N/A;    Prior to Admission medications   Medication Sig Start Date End Date  Taking? Authorizing Provider  albuterol (PROVENTIL HFA;VENTOLIN HFA) 108 (90 Base) MCG/ACT inhaler Inhale into the lungs every 6 (six) hours as needed for wheezing or shortness of breath.    [provider]  beclomethasone (QVAR) 80 MCG/ACT inhaler Inhale into the lungs 2 (two) times daily.    [provider]  hyoscyamine (LEVSIN SL) 0.125 MG SL tablet Place 0.125 mg under the tongue every 4 (four) hours as needed.    [provider]  lisinopril (PRINIVIL,ZESTRIL) 40 MG tablet Take 40 mg by mouth daily.    [provider]  metFORMIN (GLUCOPHAGE) 500 MG tablet Take by mouth 2 (two) times daily with a meal.    [provider]  metoprolol tartrate (LOPRESSOR) 25 MG tablet Take 25 mg by mouth 2 (two) times daily.    [provider]  nortriptyline (PAMELOR) 50 MG capsule Take 50 mg by mouth at bedtime.    [provider]  ondansetron (ZOFRAN ODT) 4 MG disintegrating tablet Take 1 tablet (4 mg total) by mouth every 8 (eight) hours as needed for nausea or vomiting. 05/22/16   Harvest Dark, MD  pantoprazole (PROTONIX) 40 MG tablet Take 40 mg by mouth daily.    [provider]  polyethylene glycol powder (GLYCOLAX/MIRALAX) powder TAKE 255 G BY MOUTH ONCE DAILY FOR 1 DAY. TAKE AS DIRECTED FOR COLONOSCOPY. 06/13/16   [provider]  Pregabalin (LYRICA PO) Take by mouth.    [provider]    Family History  Problem Relation Age of Onset  . Diabetes Mother   . Hypertension Mother   . Hypertension Father      Social History   Tobacco Use  . Smoking status: Former Research scientist (life sciences)  . Smokeless tobacco: Former Network engineer Use Topics  . Alcohol use: No    Comment: rare  . Drug use: No    Allergies as of 09/06/2018 - Review Complete 12/08/2016  Allergen Reaction Noted  . Hydrocodone Other (See Comments) 06/13/2016    Review of Systems:    All systems reviewed and negative except where noted in HPI.    Physical Exam:  There were no vitals taken for this visit. No LMP for male patient. Psych:  Alert and cooperative. Normal mood and affect. General:   Alert,  Well-developed, well-nourished, pleasant and cooperative in NAD Head:  Normocephalic and atraumatic. Eyes:  Sclera clear, no icterus.   Conjunctiva pink. Ears:  Normal auditory acuity. Nose:  No deformity, discharge, or lesions. Mouth:  No deformity or lesions,oropharynx pink & moist. Neck:  Supple; no masses or thyromegaly. Lungs:  Respirations even and unlabored.  Clear throughout to auscultation.   No wheezes, crackles, or rhonchi. No acute distress. Heart:  Regular rate and rhythm; no murmurs, clicks, rubs, or gallops. Abdomen:  Normal bowel sounds.  No bruits.  Soft, non-tender and non-distended without masses, hepatosplenomegaly or hernias noted.  No guarding or rebound tenderness.    Neurologic:  Alert and oriented x3;  grossly normal neurologically. Skin:  Intact without significant lesions or rashes. No jaundice. Lymph Nodes:  No significant cervical adenopathy. Psych:  Alert and cooperative. Normal mood and affect.  Imaging Studies: No results found.  Assessment and Plan:   [name] is a 48 y.o. y/o male has been referred for rectal bleeding. Colonoscopy in 10/2016 showed internal hemorroids.  At this point of time he has ongoing rectal bleeding on and off with blood clots and dark red in nature which probably is related to hemorrhoids but since his been close to 2 years since his last evaluation I would suggest we proceed with a colonoscopy to confirm this is indeed from hemorrhoids and if it is from hemorrhoids then we would bring him back to our office for hemorrhoidal banding.  I have discussed alternative options, risks & benefits,  which include, but are not limited to, bleeding, infection, perforation,respiratory complication & drug reaction.  The patient agrees with this plan & written consent will be obtained.       Follow up in 6 weeks  Dr Steven Bellows MD,MRCP(U.K)

## 2018-09-10 ENCOUNTER — Other Ambulatory Visit
Admission: RE | Admit: 2018-09-10 | Discharge: 2018-09-10 | Disposition: A | Discharge status: Home / Self Care | Payer: Medicare Other | Source: Ambulatory Visit | Attending: Gastroenterology | Admitting: Gastroenterology

## 2018-09-10 ENCOUNTER — Other Ambulatory Visit: Payer: Self-pay

## 2018-09-10 DIAGNOSIS — Z20828 Contact with and (suspected) exposure to other viral communicable diseases: Secondary | ICD-10-CM | POA: Insufficient documentation

## 2018-09-10 DIAGNOSIS — Z01812 Encounter for preprocedural laboratory examination: Secondary | ICD-10-CM | POA: Insufficient documentation

## 2018-09-10 LAB — SARS CORONAVIRUS 2 (TAT 6-24 HRS): SARS Coronavirus 2: NEGATIVE

## 2018-09-11 ENCOUNTER — Other Ambulatory Visit: Payer: Medicare Other

## 2018-09-14 ENCOUNTER — Ambulatory Visit: Payer: Medicare Other | Admitting: Anesthesiology

## 2018-09-14 ENCOUNTER — Other Ambulatory Visit: Payer: Self-pay

## 2018-09-14 ENCOUNTER — Ambulatory Visit
Admission: RE | Admit: 2018-09-14 | Discharge: 2018-09-14 | Disposition: A | Discharge status: Home / Self Care | Payer: Medicare Other | Attending: Gastroenterology | Admitting: Gastroenterology

## 2018-09-14 ENCOUNTER — Encounter: Payer: Self-pay | Admitting: *Deleted

## 2018-09-14 ENCOUNTER — Encounter
Admission: RE | Disposition: A | Discharge status: Home / Self Care | Payer: Self-pay | Source: Home / Self Care | Attending: Gastroenterology

## 2018-09-14 DIAGNOSIS — G8929 Other chronic pain: Secondary | ICD-10-CM | POA: Diagnosis not present

## 2018-09-14 DIAGNOSIS — F329 Major depressive disorder, single episode, unspecified: Secondary | ICD-10-CM | POA: Diagnosis not present

## 2018-09-14 DIAGNOSIS — K219 Gastro-esophageal reflux disease without esophagitis: Secondary | ICD-10-CM | POA: Insufficient documentation

## 2018-09-14 DIAGNOSIS — K5730 Diverticulosis of large intestine without perforation or abscess without bleeding: Secondary | ICD-10-CM | POA: Diagnosis not present

## 2018-09-14 DIAGNOSIS — I119 Hypertensive heart disease without heart failure: Secondary | ICD-10-CM | POA: Diagnosis not present

## 2018-09-14 DIAGNOSIS — D125 Benign neoplasm of sigmoid colon: Secondary | ICD-10-CM | POA: Insufficient documentation

## 2018-09-14 DIAGNOSIS — E119 Type 2 diabetes mellitus without complications: Secondary | ICD-10-CM | POA: Diagnosis not present

## 2018-09-14 DIAGNOSIS — K625 Hemorrhage of anus and rectum: Secondary | ICD-10-CM

## 2018-09-14 DIAGNOSIS — Z7984 Long term (current) use of oral hypoglycemic drugs: Secondary | ICD-10-CM | POA: Insufficient documentation

## 2018-09-14 DIAGNOSIS — G4730 Sleep apnea, unspecified: Secondary | ICD-10-CM | POA: Insufficient documentation

## 2018-09-14 DIAGNOSIS — K529 Noninfective gastroenteritis and colitis, unspecified: Secondary | ICD-10-CM | POA: Insufficient documentation

## 2018-09-14 DIAGNOSIS — J449 Chronic obstructive pulmonary disease, unspecified: Secondary | ICD-10-CM | POA: Insufficient documentation

## 2018-09-14 DIAGNOSIS — Z79899 Other long term (current) drug therapy: Secondary | ICD-10-CM | POA: Insufficient documentation

## 2018-09-14 DIAGNOSIS — K640 First degree hemorrhoids: Secondary | ICD-10-CM | POA: Diagnosis not present

## 2018-09-14 DIAGNOSIS — G43909 Migraine, unspecified, not intractable, without status migrainosus: Secondary | ICD-10-CM | POA: Diagnosis not present

## 2018-09-14 DIAGNOSIS — F17290 Nicotine dependence, other tobacco product, uncomplicated: Secondary | ICD-10-CM | POA: Insufficient documentation

## 2018-09-14 DIAGNOSIS — Z7951 Long term (current) use of inhaled steroids: Secondary | ICD-10-CM | POA: Insufficient documentation

## 2018-09-14 HISTORY — PX: COLONOSCOPY WITH PROPOFOL: SHX5780

## 2018-09-14 LAB — GLUCOSE, CAPILLARY: Glucose-Capillary: 102 mg/dL — ABNORMAL HIGH (ref 70–99)

## 2018-09-14 SURGERY — COLONOSCOPY WITH PROPOFOL
Anesthesia: General

## 2018-09-14 MED ORDER — LIDOCAINE HCL (CARDIAC) PF 100 MG/5ML IV SOSY
PREFILLED_SYRINGE | INTRAVENOUS | Status: DC | PRN
  Administered 2018-09-14: 30 mg via INTRAVENOUS

## 2018-09-14 MED ORDER — PROPOFOL 500 MG/50ML IV EMUL
INTRAVENOUS | Status: DC | PRN
  Administered 2018-09-14: 100 ug/kg/min via INTRAVENOUS

## 2018-09-14 MED ORDER — PROPOFOL 10 MG/ML IV BOLUS
INTRAVENOUS | Status: DC | PRN
  Administered 2018-09-14 (×2): 50 mg via INTRAVENOUS

## 2018-09-14 MED ORDER — PROPOFOL 500 MG/50ML IV EMUL
INTRAVENOUS | Status: AC
  Filled 2018-09-14: qty 50

## 2018-09-14 MED ORDER — LIDOCAINE HCL (PF) 2 % IJ SOLN
INTRAMUSCULAR | Status: AC
  Filled 2018-09-14: qty 10

## 2018-09-14 MED ORDER — SODIUM CHLORIDE 0.9 % IV SOLN
INTRAVENOUS | Status: DC
  Administered 2018-09-14: 10:00:00 via INTRAVENOUS

## 2018-09-14 NOTE — H&P (Signed)
[name], MD [address], [place], [place], [place], [address] [address], [place], [place], [place], [address] Phone: [phone]  Fax: [phone]  Primary Care Physician:  Theotis Burrow, MD   Pre-Procedure History & Physical: HPI:  [name] is a 48 y.o. male is here for an colonoscopy.   Past Medical History:  Diagnosis Date  . Anxiety   . Chronic back pain   . Chronic neck pain   . COPD (chronic obstructive pulmonary disease) ([place])   . Depression   . Diabetes mellitus without complication ([place])   . Enlarged heart    pt report  . Enlarged kidney Left  . GERD (gastroesophageal reflux disease)   . Headache, migraine   . Hypertension   . Sleep apnea     Past Surgical History:  Procedure Laterality Date  . CIRCUMCISION    . COLONOSCOPY WITH PROPOFOL N/A 11/01/2016   Procedure: COLONOSCOPY WITH PROPOFOL;  Surgeon: [name], MD;  Location: [facility] ENDOSCOPY;  Service: Gastroenterology;  Laterality: N/A;    Prior to Admission medications   Medication Sig Start Date End Date Taking? Authorizing Provider  albuterol (PROVENTIL HFA;VENTOLIN HFA) 108 (90 Base) MCG/ACT inhaler Inhale into the lungs every 6 (six) hours as needed for wheezing or shortness of breath.   Yes [provider]  beclomethasone (QVAR) 80 MCG/ACT inhaler Inhale into the lungs 2 (two) times daily.   Yes [provider]  lisinopril (PRINIVIL,ZESTRIL) 40 MG tablet Take 40 mg by mouth daily.   Yes [provider]  metFORMIN (GLUCOPHAGE) 500 MG tablet Take by mouth 2 (two) times daily with a meal.   Yes [provider]  metoprolol tartrate (LOPRESSOR) 25 MG tablet Take 25 mg by mouth 2 (two) times daily.   Yes [provider]  nortriptyline (PAMELOR) 50 MG capsule Take 50 mg by mouth at bedtime.   Yes [provider]  pantoprazole (PROTONIX) 40 MG tablet Take 40 mg by mouth daily.   Yes [provider]  Pregabalin (LYRICA  PO) Take by mouth.   Yes [provider]  hyoscyamine (LEVSIN SL) 0.125 MG SL tablet Place 0.125 mg under the tongue every 4 (four) hours as needed.    [provider]  ondansetron (ZOFRAN ODT) 4 MG disintegrating tablet Take 1 tablet (4 mg total) by mouth every 8 (eight) hours as needed for nausea or vomiting. Patient not taking: Reported on 09/06/2018 05/22/16   Harvest Dark, MD  polyethylene glycol powder (GLYCOLAX/MIRALAX) powder TAKE 255 G BY MOUTH ONCE DAILY FOR 1 DAY. TAKE AS DIRECTED FOR COLONOSCOPY. 06/13/16   [provider]  topiramate (TOPAMAX) 50 MG tablet TAKE 1 TABLET BY MOUTH EVERY DAY 09/04/18   [provider]    Allergies as of 09/06/2018 - Review Complete 09/06/2018  Allergen Reaction Noted  . Hydrocodone Other (See Comments) 06/13/2016    Family History  Problem Relation Age of Onset  . Diabetes Mother   . Hypertension Mother   . Hypertension Father     Social History   Socioeconomic History  . Marital status: Married    Spouse name: Not on file  . Number of children: Not on file  . Years of education: Not on file  . Highest education level: Not on file  Occupational History  . Not on file  Social Needs  . Financial resource strain: Not on file  . Food insecurity    Worry: Not on file  Inability: Not on file  . Transportation needs    Medical: Not on file    Non-medical: Not on file  Tobacco Use  . Smoking status: Current Some Day Smoker    Types: Cigars  . Smokeless tobacco: Former Network engineer and Sexual Activity  . Alcohol use: No    Comment: rare  . Drug use: No  . Sexual activity: Not on file  Lifestyle  . Physical activity    Days per week: Not on file    Minutes per session: Not on file  . Stress: Not on file  Relationships  . Social Herbalist on phone: Not on file    Gets together: Not on file    Attends religious service: Not on file    Active member of club or organization: Not  on file    Attends meetings of clubs or organizations: Not on file    Relationship status: Not on file  . Intimate partner violence    Fear of current or ex partner: Not on file    Emotionally abused: Not on file    Physically abused: Not on file    Forced sexual activity: Not on file  Other Topics Concern  . Not on file  Social History Narrative  . Not on file    Review of Systems: See HPI, otherwise negative ROS  Physical Exam: BP (!) 128/98   Pulse 79   Temp (!) 97.2 F (36.2 C) (Tympanic)   Resp 16   Ht 5\' 8"  (1.727 m)   Wt 84.4 kg   SpO2 99%   BMI 28.28 kg/m  General:   Alert,  pleasant and cooperative in NAD Head:  Normocephalic and atraumatic. Neck:  Supple; no masses or thyromegaly. Lungs:  Clear throughout to auscultation, normal respiratory effort.    Heart:  +S1, +S2, Regular rate and rhythm, No edema. Abdomen:  Soft, nontender and nondistended. Normal bowel sounds, without guarding, and without rebound.   Neurologic:  Alert and  oriented x4;  grossly normal neurologically.  Impression/Plan: [name] is here for an colonoscopy to be performed for rectal bleeding . Risks, benefits, limitations, and alternatives regarding  colonoscopy have been reviewed with the patient.  Questions have been answered.  All parties agreeable.   [name], MD  09/14/2018, 11:07 AM \

## 2018-09-14 NOTE — Anesthesia Preprocedure Evaluation (Signed)
Anesthesia Evaluation  Patient identified by MRN, date of birth, ID band Patient awake    Reviewed: Allergy & Precautions, NPO status , Patient's Chart, lab work & pertinent test results  History of Anesthesia Complications Negative for: history of anesthetic complications  Airway Mallampati: II  TM Distance: >3 FB Neck ROM: Full    Dental no notable dental hx.    Pulmonary sleep apnea and Continuous Positive Airway Pressure Ventilation , COPD,  COPD inhaler, Current Smoker and Patient abstained from smoking.,    breath sounds clear to auscultation- rhonchi (-) wheezing      Cardiovascular hypertension, Pt. on medications (-) CAD, (-) Past MI, (-) Cardiac Stents and (-) CABG  Rhythm:Regular Rate:Normal - Systolic murmurs and - Diastolic murmurs    Neuro/Psych  Headaches, neg Seizures PSYCHIATRIC DISORDERS Anxiety Depression    GI/Hepatic Neg liver ROS, GERD  ,  Endo/Other  diabetes, Oral Hypoglycemic Agents  Renal/GU negative Renal ROS     Musculoskeletal negative musculoskeletal ROS (+)   Abdominal (+) - obese,   Peds  Hematology negative hematology ROS (+)   Anesthesia Other Findings Past Medical History: No date: Anxiety No date: Chronic back pain No date: Chronic neck pain No date: COPD (chronic obstructive pulmonary disease) ([facility]) No date: Depression No date: Diabetes mellitus without complication ([facility]) No date: Enlarged heart     Comment:  pt report Left: Enlarged kidney No date: GERD (gastroesophageal reflux disease) No date: Headache, migraine No date: Hypertension No date: Sleep apnea   Reproductive/Obstetrics                             Anesthesia Physical Anesthesia Plan  ASA: III  Anesthesia Plan: General   Post-op Pain Management:    Induction: Intravenous  PONV Risk Score and Plan: 0 and Propofol infusion  Airway Management Planned: Natural  Airway  Additional Equipment:   Intra-op Plan:   Post-operative Plan:   Informed Consent: I have reviewed the patients History and Physical, chart, labs and discussed the procedure including the risks, benefits and alternatives for the proposed anesthesia with the patient or authorized representative who has indicated his/her understanding and acceptance.     Dental advisory given  Plan Discussed with: CRNA and Anesthesiologist  Anesthesia Plan Comments:         Anesthesia Quick Evaluation

## 2018-09-14 NOTE — Transfer of Care (Signed)
Immediate Anesthesia Transfer of Care Note  Patient: [name]  Procedure(s) Performed: COLONOSCOPY WITH PROPOFOL (N/A )  Patient Location: PACU and Endoscopy Unit  Anesthesia Type:General  Level of Consciousness: alert   Airway & Oxygen Therapy: Patient Spontanous Breathing  Post-op Assessment: Report given to RN  Post vital signs: stable  Last Vitals:  Vitals Value Taken Time  BP 106/69 09/14/18 1143  Temp    Pulse 77 09/14/18 1143  Resp 23 09/14/18 1143  SpO2 98 % 09/14/18 1143  Vitals shown include unvalidated device data.  Last Pain:  Vitals:   09/14/18 0941  TempSrc: Tympanic         Complications: No apparent anesthesia complications

## 2018-09-14 NOTE — Anesthesia Postprocedure Evaluation (Signed)
Anesthesia Post Note  Patient: [name]  Procedure(s) Performed: COLONOSCOPY WITH PROPOFOL (N/A )  Patient location during evaluation: Endoscopy Anesthesia Type: General Level of consciousness: awake and alert and oriented Pain management: pain level controlled Vital Signs Assessment: post-procedure vital signs reviewed and stable Respiratory status: spontaneous breathing, nonlabored ventilation and respiratory function stable Cardiovascular status: blood pressure returned to baseline and stable Postop Assessment: no signs of nausea or vomiting Anesthetic complications: no     Last Vitals:  Vitals:   09/14/18 0941 09/14/18 1143  BP: (!) 128/98 106/69  Pulse: 79 77  Resp: 16 (!) 23  Temp: (!) 36.2 C (!) 36.2 C  SpO2: 99% 98%    Last Pain:  Vitals:   09/14/18 1143  TempSrc: Tympanic  PainSc: Asleep                 Justis Closser

## 2018-09-14 NOTE — Op Note (Signed)
[facility] Gastroenterology Patient Name: [name] Procedure Date: 09/14/2018 11:16 AM MRN: [id number] Account #: [id number] Date of Birth: January 06, 1971 Admit Type: Outpatient Age: 48 Room: [facility] ENDO ROOM 4 Gender: Male Note Status: Finalized Procedure:            Colonoscopy Indications:          Rectal bleeding Providers:            [name] MD, MD Referring MD:         Elyse Jarvis Revelo (Referring MD) Medicines:            Monitored Anesthesia Care Complications:        No immediate complications. Procedure:            Pre-Anesthesia Assessment:                       - Prior to the procedure, a History and Physical was                        performed, and patient medications, allergies and                        sensitivities were reviewed. The patient's tolerance of                        previous anesthesia was reviewed.                       - The risks and benefits of the procedure and the                        sedation options and risks were discussed with the                        patient. All questions were answered and informed                        consent was obtained.                       - ASA Grade Assessment: II - A patient with mild                        systemic disease.                       After obtaining informed consent, the colonoscope was                        passed under direct vision. Throughout the procedure,                        the patient's blood pressure, pulse, and oxygen                        saturations were monitored continuously. The                        Colonoscope was introduced through the anus and                        advanced to  the the cecum, identified by the                        appendiceal orifice. The colonoscopy was performed with                        ease. The patient tolerated the procedure well. The                        quality of the bowel preparation was excellent. Findings:   Three sessile polyps were found in the sigmoid colon. The polyps were 4       to 6 mm in size. These polyps were removed with a cold snare. Resection       and retrieval were complete. To prevent bleeding after the polypectomy,       one hemostatic clip was successfully placed. There was no bleeding       during, or at the end, of the procedure.      Non-bleeding internal hemorrhoids were found during retroflexion. The       hemorrhoids were medium-sized and Grade I (internal hemorrhoids that do       not prolapse).      Multiple small-mouthed diverticula were found in the sigmoid colon.      The exam was otherwise without abnormality. Impression:           - Three 4 to 6 mm polyps in the sigmoid colon, removed                        with a cold snare. Resected and retrieved. Clip was                        placed.                       - Non-bleeding internal hemorrhoids.                       - Diverticulosis in the sigmoid colon.                       - The examination was otherwise normal. Recommendation:       - Discharge patient to home (with escort).                       - Resume previous diet.                       - Continue present medications.                       - Await pathology results.                       - Repeat colonoscopy for surveillance based on                        pathology results.                       - Return to GI office in 4 weeks.                       -  Need to see Dr Marius Ditch for hemorroidal banding Procedure Code(s):    --- Professional ---                       [phone], Colonoscopy, flexible; with removal of tumor(s),                        polyp(s), or other lesion(s) by snare technique Diagnosis Code(s):    --- Professional ---                       K63.5, Polyp of colon                       K64.0, First degree hemorrhoids                       K62.5, Hemorrhage of anus and rectum                       K57.30, Diverticulosis of large intestine  without                        perforation or abscess without bleeding CPT copyright 2019 American Medical Association. All rights reserved. The codes documented in this report are preliminary and upon coder review may  be revised to meet current compliance requirements. [name], MD [name] MD, MD 09/14/2018 11:39:29 AM This report has been signed electronically. Number of Addenda: 0 Note Initiated On: 09/14/2018 11:16 AM Scope Withdrawal Time: 0 hours 13 minutes 2 seconds  Total Procedure Duration: 0 hours 15 minutes 6 seconds  Estimated Blood Loss: Estimated blood loss: none.      [facility]

## 2018-09-14 NOTE — Anesthesia Post-op Follow-up Note (Signed)
Anesthesia QCDR form completed.        

## 2018-09-17 ENCOUNTER — Encounter: Payer: Self-pay | Admitting: Gastroenterology

## 2018-09-17 LAB — SURGICAL PATHOLOGY

## 2018-09-23 ENCOUNTER — Encounter: Payer: Self-pay | Admitting: Gastroenterology

## 2018-10-18 ENCOUNTER — Other Ambulatory Visit: Payer: Self-pay

## 2018-10-18 ENCOUNTER — Ambulatory Visit (INDEPENDENT_AMBULATORY_CARE_PROVIDER_SITE_OTHER): Payer: Medicare Other | Admitting: Gastroenterology

## 2018-10-18 VITALS — BP 122/83 | HR 76 | Temp 98.6°F | Ht 68.0 in | Wt 191.0 lb

## 2018-10-18 DIAGNOSIS — K625 Hemorrhage of anus and rectum: Secondary | ICD-10-CM

## 2018-10-18 DIAGNOSIS — R197 Diarrhea, unspecified: Secondary | ICD-10-CM

## 2018-10-18 NOTE — Progress Notes (Signed)
[name] MD, MRCP(U.K) [address]  [place]  [place], [place] [address]  Main: [phone]  Fax: [phone]   Primary Care Physician: Theotis Burrow, MD  Primary Gastroenterologist:  Dr. [name]   Follow-up for rectal bleeding  HPI: [name] is a 47 y.o. male   Summary of history :  He was last seen by me on 09/06/2018.  He has a history of Campylobacter and E. coli dysentery over 2 years back.  He underwent a colonoscopy in 10/27/2016 which showed medium size internal hemorrhoids. He says that he did well after his colonoscopy and rather overall his abdominal pain completely disappeared which he had at that point of time.  He has had on and off rectal bleeding associated with passage of soft stool sometimes associated with clots describes it as dark red in color.  Denies any weight loss.  Denies any perianal itching.  Denies any passage of hard stool.  He has a bowel movement every day.   Interval history   09/06/2018-10/18/2018  09/14/2018: Colonoscopy: 3 sessile polyps were seen and resected, diverticulosis of the colon and internal hemorrhoids were noted.  They were nonbleeding.  One tubular adenoma and 2 hyperplastic polyps were noted.  He states he has not having any more rectal bleeding but he states that he has been having a loose stools 4-5 times a day for the past 4 months.  Denies consumption of any artificial sugars, any diet soda, chewing gum.  He is on metformin for diabetes.  Current Outpatient Medications  Medication Sig Dispense Refill  . albuterol (PROVENTIL HFA;VENTOLIN HFA) 108 (90 Base) MCG/ACT inhaler Inhale into the lungs every 6 (six) hours as needed for wheezing or shortness of breath.    . beclomethasone (QVAR) 80 MCG/ACT inhaler Inhale into the lungs 2 (two) times daily.    . hyoscyamine (LEVSIN SL) 0.125 MG SL tablet Place 0.125 mg under the tongue every 4 (four) hours as needed.    Marland Kitchen lisinopril (PRINIVIL,ZESTRIL) 40 MG tablet  Take 40 mg by mouth daily.    . metFORMIN (GLUCOPHAGE) 500 MG tablet Take by mouth 2 (two) times daily with a meal.    . metoprolol tartrate (LOPRESSOR) 25 MG tablet Take 25 mg by mouth 2 (two) times daily.    . nortriptyline (PAMELOR) 50 MG capsule Take 50 mg by mouth at bedtime.    . ondansetron (ZOFRAN ODT) 4 MG disintegrating tablet Take 1 tablet (4 mg total) by mouth every 8 (eight) hours as needed for nausea or vomiting. (Patient not taking: Reported on 09/06/2018) 20 tablet 0  . pantoprazole (PROTONIX) 40 MG tablet Take 40 mg by mouth daily.    . polyethylene glycol powder (GLYCOLAX/MIRALAX) powder TAKE 255 G BY MOUTH ONCE DAILY FOR 1 DAY. TAKE AS DIRECTED FOR COLONOSCOPY.  0  . Pregabalin (LYRICA PO) Take by mouth.    . topiramate (TOPAMAX) 50 MG tablet TAKE 1 TABLET BY MOUTH EVERY DAY     No current facility-administered medications for this visit.     Allergies as of 10/18/2018 - Review Complete 09/14/2018  Allergen Reaction Noted  . Hydrocodone Other (See Comments) 06/13/2016    ROS:  General: Negative for anorexia, weight loss, fever, chills, fatigue, weakness. ENT: Negative for hoarseness, difficulty swallowing , nasal congestion. CV: Negative for chest pain, angina, palpitations, dyspnea on exertion, peripheral edema.  Respiratory: Negative for dyspnea at rest, dyspnea on exertion, cough, sputum, wheezing.  GI: See history of present illness. GU:  Negative for dysuria, hematuria, urinary incontinence, urinary frequency, nocturnal urination.  Endo: Negative for unusual weight change.    Physical Examination:   There were no vitals taken for this visit.  General: Well-nourished, well-developed in no acute distress.  Eyes: No icterus. Conjunctivae pink. Mouth: Oropharyngeal mucosa moist and pink , no lesions erythema or exudate. Lungs: Clear to auscultation bilaterally. Non-labored. Heart: Regular rate and rhythm, no murmurs rubs or gallops.  Abdomen: Bowel sounds are  normal, nontender, nondistended, no hepatosplenomegaly or masses, no abdominal bruits or hernia , no rebound or guarding.   Extremities: No lower extremity edema. No clubbing or deformities. Neuro: Alert and oriented x 3.  Grossly intact. Skin: Warm and dry, no jaundice.   Psych: Alert and cooperative, normal mood and affect.   Imaging Studies: No results found.  Assessment and Plan:   [name] is a 48 y.o. y/o male here to follow-up for rectal bleeding. Colonoscopy in 10/2016 showed internal hemorroids.    No further rectal bleeding since he adhered to lifestyle changes for his hemorrhoids.  He does complain of diarrhea for the past 4 months.  The only aspect I could elicit from his history contributing to the diarrhea is probably the metformin.  I informed him that we will check a stool for GI PCR and C. difficile and if negative suggest him to consider stopping the metformin after discussion with Dr. Ladoris Gene for a few weeks and if the diarrhea resolves is probably due to the metformin but if the diarrhea persists is probably not due to his metformin.  In the meanwhile also suggested him to take a few fiber pills samples of which will be provided to help bulk up the stool and reduce diarrhea.  Dr [name]  MD,MRCP [facility]) Follow up in 6 weeks

## 2018-10-23 ENCOUNTER — Encounter: Payer: Self-pay | Admitting: Gastroenterology

## 2018-10-24 LAB — GI PROFILE, STOOL, PCR

## 2018-10-24 LAB — C DIFFICILE, CYTOTOXIN B

## 2018-10-24 LAB — C DIFFICILE TOXINS A+B W/RFLX: C difficile Toxins A+B, EIA: NEGATIVE

## 2018-10-25 ENCOUNTER — Telehealth: Payer: Self-pay

## 2018-10-25 NOTE — Telephone Encounter (Signed)
Called pt to inform him of lab results and Dr. Georgeann Oppenheim suggestions.   Unable to contact, No VM set up on either phone. Will send letter.

## 2018-10-25 NOTE — Telephone Encounter (Signed)
-----   Message from [name], MD sent at 10/23/2018  8:50 AM EDT ----- Sherald Hess can you please inform him that his stool studies are negative for any infection.  If he still having the diarrhea would suggest to consider him discussing with his primary care physician Dr. Alene Mires whether he could go on a trial of discontinuing his metformin for a few weeks to see if his diarrhea resolves.  Suggest follow-up in 6 to 8 weeks  Dr [name] MD,MRCP [facility]) Gastroenterology/Hepatology Pager: [phone]

## 2018-11-08 NOTE — Telephone Encounter (Addendum)
Unable to leave a message on both numbers

## 2019-01-17 ENCOUNTER — Other Ambulatory Visit: Payer: Self-pay

## 2019-01-17 DIAGNOSIS — Z20822 Contact with and (suspected) exposure to covid-19: Secondary | ICD-10-CM

## 2019-01-19 LAB — NOVEL CORONAVIRUS, NAA: SARS-CoV-2, NAA: NOT DETECTED

## 2019-01-21 ENCOUNTER — Other Ambulatory Visit: Payer: Self-pay

## 2019-01-24 ENCOUNTER — Other Ambulatory Visit: Payer: Self-pay

## 2019-07-26 ENCOUNTER — Other Ambulatory Visit: Payer: Self-pay

## 2019-07-26 ENCOUNTER — Ambulatory Visit: Payer: Medicare Other | Admitting: Urology

## 2019-07-26 ENCOUNTER — Other Ambulatory Visit: Payer: Self-pay | Admitting: Family Medicine

## 2019-07-26 ENCOUNTER — Encounter: Payer: Self-pay | Admitting: Urology

## 2019-07-26 VITALS — BP 120/73 | HR 92 | Ht 68.0 in | Wt 193.0 lb

## 2019-07-26 DIAGNOSIS — E291 Testicular hypofunction: Secondary | ICD-10-CM

## 2019-07-26 NOTE — Progress Notes (Signed)
07/26/2019 11:19 AM   [name] 02/21/1970 [id number]  Referring provider: Theotis Burrow, MD [address] [place],  [place] [address]  Chief Complaint  Patient presents with   Hypogonadism    HPI: [name] is a 49 y.o. male seen at the request of Dr. Alene Long for evaluation of hypogonadism.  -Complains of tiredness, fatigue and erectile dysfunction -Libido okay -No bothersome LUTS -AM testosterone level 07/18/2019 low at 198   PMH: Past Medical History:  Diagnosis Date   Anxiety    Chronic back pain    Chronic neck pain    COPD (chronic obstructive pulmonary disease) ([place])    Depression    Diabetes mellitus without complication ([place])    Enlarged heart    pt report   Enlarged kidney Left   GERD (gastroesophageal reflux disease)    Headache, migraine    Hypertension    Sleep apnea     Surgical History: Past Surgical History:  Procedure Laterality Date   CIRCUMCISION     COLONOSCOPY WITH PROPOFOL N/A 11/01/2016   Procedure: COLONOSCOPY WITH PROPOFOL;  Surgeon: Steven Bellows, MD;  Location[facility] ENDOSCOPY;  Service: Gastroenterology;  Laterality: N/A;   COLONOSCOPY WITH PROPOFOL N/A 09/14/2018   Procedure: COLONOSCOPY WITH PROPOFOL;  Surgeon: Steven Bellows, MD;  Location[facility] ENDOSCOPY;  Service: Gastroenterology;  Laterality: N/A;    Home Medications:  Allergies as of 07/26/2019      Reactions   Hydrocodone Other (See Comments)   Constipation      Medication List       Accurate as of July 26, 2019 11:19 AM. If you have any questions, ask your nurse or doctor.        albuterol 108 (90 Base) MCG/ACT inhaler Commonly known as: VENTOLIN HFA Inhale into the lungs every 6 (six) hours as needed for wheezing or shortness of breath.   beclomethasone 80 MCG/ACT inhaler Commonly known as: QVAR Inhale into the lungs 2 (two) times daily.   hyoscyamine 0.125 MG SL tablet Commonly known as: LEVSIN SL Place 0.125 mg under the  tongue every 4 (four) hours as needed.   lisinopril 40 MG tablet Commonly known as: ZESTRIL Take 40 mg by mouth daily.   LYRICA PO Take by mouth.   metFORMIN 500 MG tablet Commonly known as: GLUCOPHAGE Take by mouth 2 (two) times daily with a meal.   metoprolol tartrate 25 MG tablet Commonly known as: LOPRESSOR Take 25 mg by mouth 2 (two) times daily.   nortriptyline 50 MG capsule Commonly known as: PAMELOR Take 50 mg by mouth at bedtime.   ondansetron 4 MG disintegrating tablet Commonly known as: Zofran ODT Take 1 tablet (4 mg total) by mouth every 8 (eight) hours as needed for nausea or vomiting.   pantoprazole 40 MG tablet Commonly known as: PROTONIX Take 40 mg by mouth daily.   polyethylene glycol powder 17 GM/SCOOP powder Commonly known as: GLYCOLAX/MIRALAX TAKE 255 G BY MOUTH ONCE DAILY FOR 1 DAY. TAKE AS DIRECTED FOR COLONOSCOPY.   topiramate 50 MG tablet Commonly known as: TOPAMAX TAKE 1 TABLET BY MOUTH EVERY DAY       Allergies:  Allergies  Allergen Reactions   Hydrocodone Other (See Comments)    Constipation    Family History: Family History  Problem Relation Age of Onset   Diabetes Mother    Hypertension Mother    Hypertension Father     Social History:  reports that he has been smoking cigars. He has  quit using smokeless tobacco. He reports that he does not drink alcohol and does not use drugs.   Physical Exam: BP 120/73    Pulse 92    Ht 5\' 8"  (1.727 m)    Wt 193 lb (87.5 kg)    BMI 29.35 kg/m   Constitutional:  Alert and oriented, No acute distress. HEENT: [place] AT, moist mucus membranes.  Trachea midline, no masses. Cardiovascular: No clubbing, cyanosis, or edema. Respiratory: Normal respiratory effort, no increased work of breathing. GU: Phallus without lesions/plaques, testes descended bilaterally without masses or tenderness, normal size, spermatic cord/epididymis palpably normal bilaterally.  Prostate 40 g, smooth without  nodules Skin: No rashes, bruises or suspicious lesions. Neurologic: Grossly intact, no focal deficits, moving all 4 extremities. Psychiatric: Normal mood and affect.   Assessment & Plan:    1. Hypogonadism Symptomatic hypogonadism and interested in TRT.  Replacement options were discussed including topicals, intramuscular injections, Xyosted and SunGard.  Treatment options may be based on insurance coverage.  He was interested in [place].  We will need a repeat a.m. testosterone level and will also check LH, prolactin and baseline PSA.  Potential side effects of testosterone replacement were discussed including stimulation of benign prostatic growth with lower urinary tract symptoms; erythrocytosis; edema; gynecomastia; worsening sleep apnea; venous thromboembolism; testicular atrophy and infertility. Recent studies suggesting an increased incidence of heart attack and stroke in patients taking testosterone was discussed. He was informed there is conflicting evidence regarding the impact of testosterone therapy on cardiovascular risk. The theoretical risk of growth stimulation of an undetected prostate cancer was also discussed.  He was informed that current evidence does not provide any definitive answers regarding the risks of testosterone therapy on prostate cancer and cardiovascular disease. The need for periodic monitoring of his testosterone level, PSA, hematocrit and DRE was discussed.    [name], [place] [address] [place], [place] [address] [phone]

## 2019-07-29 ENCOUNTER — Encounter: Payer: Self-pay | Admitting: Urology

## 2019-07-29 DIAGNOSIS — E291 Testicular hypofunction: Secondary | ICD-10-CM | POA: Insufficient documentation

## 2019-07-30 ENCOUNTER — Other Ambulatory Visit: Payer: Self-pay

## 2019-07-30 DIAGNOSIS — E291 Testicular hypofunction: Secondary | ICD-10-CM

## 2019-07-31 ENCOUNTER — Other Ambulatory Visit: Payer: Self-pay

## 2019-07-31 ENCOUNTER — Other Ambulatory Visit: Payer: Medicare Other

## 2019-07-31 DIAGNOSIS — E291 Testicular hypofunction: Secondary | ICD-10-CM

## 2019-08-01 ENCOUNTER — Telehealth: Payer: Self-pay | Admitting: *Deleted

## 2019-08-01 LAB — LUTEINIZING HORMONE: LH: 8.5 m[IU]/mL (ref 1.7–8.6)

## 2019-08-01 LAB — TESTOSTERONE: Testosterone: 141 ng/dL — ABNORMAL LOW (ref 264–916)

## 2019-08-01 LAB — PROLACTIN: Prolactin: 4.8 ng/mL (ref 4.0–15.2)

## 2019-08-01 LAB — PSA: Prostate Specific Ag, Serum: 1.6 ng/mL (ref 0.0–4.0)

## 2019-08-01 NOTE — Telephone Encounter (Signed)
-----   Message from Abbie Sons, MD sent at 08/01/2019 11:40 AM EDT ----- Repeat testosterone level remains low.  LH and prolactin were in the normal range.  PSA was normal.  He was interested and oral testosterone however Medicare currently does not cover.  He will need to decide between injections versus topical gel.  Let me know what he decides and will send in Rx.

## 2019-08-06 NOTE — Telephone Encounter (Signed)
No vocal mail

## 2019-09-14 ENCOUNTER — Emergency Department: Payer: Medicare Other

## 2019-09-14 ENCOUNTER — Emergency Department
Admission: EM | Admit: 2019-09-14 | Discharge: 2019-09-14 | Disposition: A | Discharge status: Home / Self Care | Payer: Medicare Other | Attending: Emergency Medicine | Admitting: Emergency Medicine

## 2019-09-14 ENCOUNTER — Other Ambulatory Visit: Payer: Self-pay

## 2019-09-14 ENCOUNTER — Encounter: Payer: Self-pay | Admitting: Emergency Medicine

## 2019-09-14 DIAGNOSIS — J449 Chronic obstructive pulmonary disease, unspecified: Secondary | ICD-10-CM | POA: Diagnosis not present

## 2019-09-14 DIAGNOSIS — K5732 Diverticulitis of large intestine without perforation or abscess without bleeding: Secondary | ICD-10-CM | POA: Insufficient documentation

## 2019-09-14 DIAGNOSIS — F17290 Nicotine dependence, other tobacco product, uncomplicated: Secondary | ICD-10-CM | POA: Diagnosis not present

## 2019-09-14 DIAGNOSIS — Z20822 Contact with and (suspected) exposure to covid-19: Secondary | ICD-10-CM | POA: Diagnosis not present

## 2019-09-14 DIAGNOSIS — J029 Acute pharyngitis, unspecified: Secondary | ICD-10-CM | POA: Diagnosis present

## 2019-09-14 DIAGNOSIS — E119 Type 2 diabetes mellitus without complications: Secondary | ICD-10-CM | POA: Diagnosis not present

## 2019-09-14 DIAGNOSIS — Z79899 Other long term (current) drug therapy: Secondary | ICD-10-CM | POA: Insufficient documentation

## 2019-09-14 DIAGNOSIS — R05 Cough: Secondary | ICD-10-CM | POA: Insufficient documentation

## 2019-09-14 DIAGNOSIS — K5792 Diverticulitis of intestine, part unspecified, without perforation or abscess without bleeding: Secondary | ICD-10-CM

## 2019-09-14 DIAGNOSIS — I10 Essential (primary) hypertension: Secondary | ICD-10-CM | POA: Diagnosis not present

## 2019-09-14 DIAGNOSIS — Z7984 Long term (current) use of oral hypoglycemic drugs: Secondary | ICD-10-CM | POA: Diagnosis not present

## 2019-09-14 DIAGNOSIS — D649 Anemia, unspecified: Secondary | ICD-10-CM | POA: Insufficient documentation

## 2019-09-14 LAB — COMPREHENSIVE METABOLIC PANEL
ALT: 44 U/L (ref 0–44)
AST: 37 U/L (ref 15–41)
Albumin: 3.9 g/dL (ref 3.5–5.0)
Alkaline Phosphatase: 106 U/L (ref 38–126)
Anion gap: 12 (ref 5–15)
BUN: 9 mg/dL (ref 6–20)
CO2: 25 mmol/L (ref 22–32)
Calcium: 9.3 mg/dL (ref 8.9–10.3)
Chloride: 104 mmol/L (ref 98–111)
Creatinine, Ser: 1.04 mg/dL (ref 0.61–1.24)
GFR calc Af Amer: >60 mL/min (ref >60)
GFR calc non Af Amer: >60 mL/min (ref >60)
Glucose, Bld: 123 mg/dL — ABNORMAL HIGH (ref 70–99)
Potassium: 4 mmol/L (ref 3.5–5.1)
Sodium: 141 mmol/L (ref 135–145)
Total Bilirubin: 0.7 mg/dL (ref 0.3–1.2)
Total Protein: 7.8 g/dL (ref 6.5–8.1)

## 2019-09-14 LAB — URINALYSIS, COMPLETE (UACMP) WITH MICROSCOPIC
Bacteria, UA: NONE SEEN
Bilirubin Urine: NEGATIVE
Glucose, UA: NEGATIVE mg/dL
Hgb urine dipstick: NEGATIVE
Ketones, ur: NEGATIVE mg/dL
Leukocytes,Ua: NEGATIVE
Nitrite: NEGATIVE
Protein, ur: NEGATIVE mg/dL
Specific Gravity, Urine: 1.013 (ref 1.005–1.030)
Squamous Epithelial / HPF: NONE SEEN (ref 0–5)
pH: 6 (ref 5.0–8.0)

## 2019-09-14 LAB — SARS CORONAVIRUS 2 BY RT PCR (HOSPITAL ORDER, PERFORMED IN ~~LOC~~ HOSPITAL LAB): SARS Coronavirus 2: NEGATIVE

## 2019-09-14 LAB — CBC
HCT: 30.5 % — ABNORMAL LOW (ref 39.0–52.0)
Hemoglobin: 8.5 g/dL — ABNORMAL LOW (ref 13.0–17.0)
MCH: 19.6 pg — ABNORMAL LOW (ref 26.0–34.0)
MCHC: 27.9 g/dL — ABNORMAL LOW (ref 30.0–36.0)
MCV: 70.4 fL — ABNORMAL LOW (ref 80.0–100.0)
Platelets: 482 10*3/uL — ABNORMAL HIGH (ref 150–400)
RBC: 4.33 MIL/uL (ref 4.22–5.81)
RDW: 18.9 % — ABNORMAL HIGH (ref 11.5–15.5)
WBC: 6.9 10*3/uL (ref 4.0–10.5)
nRBC: 0 % (ref 0.0–0.2)

## 2019-09-14 LAB — LIPASE, BLOOD: Lipase: 68 U/L — ABNORMAL HIGH (ref 11–51)

## 2019-09-14 MED ORDER — IOHEXOL 300 MG/ML  SOLN
100.0000 mL | Freq: Once | INTRAMUSCULAR | Status: AC | PRN
  Administered 2019-09-14: 100 mL via INTRAVENOUS

## 2019-09-14 MED ORDER — SODIUM CHLORIDE 0.9% FLUSH: 3.0000 mL | Freq: Once | INTRAVENOUS | Status: DC

## 2019-09-14 MED ORDER — ONDANSETRON HCL 4 MG PO TABS: 4.0000 mg | ORAL_TABLET | Freq: Three times a day (TID) | ORAL | 0 refills | Status: DC | PRN

## 2019-09-14 MED ORDER — AMOXICILLIN-POT CLAVULANATE 875-125 MG PO TABS: 1.0000 | ORAL_TABLET | Freq: Two times a day (BID) | ORAL | 0 refills | Status: AC

## 2019-09-14 NOTE — Progress Notes (Signed)
[place] encountered pt.'s wife in ED hallway needing assistance getting a cloth to wipe pt.'s forehead; CH obtained a washcloth for her.  When [facility] entered rm. pt. lying down on bed; said he has been nauseous for about three weeks and is feeling a little bit 'anxious' about this.  Pt. and wife requested prayer; [place] prayed for them and for pt.'s medical team.  [facility] remains available as needed.

## 2019-09-14 NOTE — ED Triage Notes (Signed)
Pt presents to ED via pOV with c/o sore throat and N/V x 3 weeks. Pt states seen at PCP for same without relief. Pt A&O x4, ambulatory with steady gait at this time.

## 2019-09-14 NOTE — ED Provider Notes (Signed)
[facility] Emergency Department Provider Note  ____________________________________________   First MD Initiated Contact with Patient 09/14/19 1401     (approximate)  I have reviewed the triage vital signs and the nursing notes.   HISTORY  Chief Complaint Nausea and Sore Throat   HPI Ronnie Doo is a 49 y.o. male with a past medical history of chronic back pain, COPD, DM, GERD, depression, HTN, and OSA who presents for assessment approximately 3 weeks of worsening nausea and daily vomiting associated with some diarrhea cough and 10 pound weight loss.  Patient denies any fevers, chills, headache, earache, vision changes, chest pain, acute back pain, incontinence, dysuria, blood in stool, blood in his urine, rash, focal extremity pain, recent injuries or falls, or other acute complaints.  States he drinks about 2 shots of liquor per day and smokes occasionally.  Denies illicit drug use.  No prior synopsis.  No clear alleviating or aggravating factors.         Past Medical History:  Diagnosis Date  . Anxiety   . Chronic back pain   . Chronic neck pain   . COPD (chronic obstructive pulmonary disease) ([place])   . Depression   . Diabetes mellitus without complication ([place])   . Enlarged heart    pt report  . Enlarged kidney Left  . GERD (gastroesophageal reflux disease)   . Headache, migraine   . Hypertension   . Sleep apnea     Patient Active Problem List   Diagnosis Date Noted  . Hypogonadism, male 07/29/2019  . Sepsis ([place]) 07/23/2016  . Bloody diarrhea 07/23/2016  . Diabetes ([place]) 07/23/2016  . HTN (hypertension) 07/23/2016  . Anxiety 07/23/2016  . GERD (gastroesophageal reflux disease) 07/23/2016  . Chest pain 04/08/2016  . Blood in the stool 03/29/2016  . Bilateral carpal tunnel syndrome 03/21/2016  . Chronic bilateral low back pain without sciatica 01/12/2016  . Numbness and tingling 01/12/2016  . Anxiety, generalized 12/01/2015  .  Difficulty sleeping 12/01/2015  . Migraine without aura and without status migrainosus, not intractable 12/01/2015    Past Surgical History:  Procedure Laterality Date  . CIRCUMCISION    . COLONOSCOPY WITH PROPOFOL N/A 11/01/2016   Procedure: COLONOSCOPY WITH PROPOFOL;  Surgeon: [name], MD;  Location: [facility] ENDOSCOPY;  Service: Gastroenterology;  Laterality: N/A;  . COLONOSCOPY WITH PROPOFOL N/A 09/14/2018   Procedure: COLONOSCOPY WITH PROPOFOL;  Surgeon: [name], MD;  Location: [facility] ENDOSCOPY;  Service: Gastroenterology;  Laterality: N/A;    Prior to Admission medications   Medication Sig Start Date End Date Taking? Authorizing Provider  albuterol (PROVENTIL HFA;VENTOLIN HFA) 108 (90 Base) MCG/ACT inhaler Inhale into the lungs every 6 (six) hours as needed for wheezing or shortness of breath.    [provider]  amoxicillin-clavulanate (AUGMENTIN) 875-125 MG tablet Take 1 tablet by mouth 2 (two) times daily for 10 days. 09/14/19 09/24/19  Lucrezia Starch, MD  beclomethasone (QVAR) 80 MCG/ACT inhaler Inhale into the lungs 2 (two) times daily.    [provider]  hyoscyamine (LEVSIN SL) 0.125 MG SL tablet Place 0.125 mg under the tongue every 4 (four) hours as needed.    [provider]  lisinopril (PRINIVIL,ZESTRIL) 40 MG tablet Take 40 mg by mouth daily.    [provider]  metFORMIN (GLUCOPHAGE) 500 MG tablet Take by mouth 2 (two) times daily with a meal.    [provider]  metoprolol tartrate (LOPRESSOR) 25 MG tablet Take 25 mg by mouth 2 (  two) times daily.    [provider]  nortriptyline (PAMELOR) 50 MG capsule Take 50 mg by mouth at bedtime.    [provider]  ondansetron (ZOFRAN ODT) 4 MG disintegrating tablet Take 1 tablet (4 mg total) by mouth every 8 (eight) hours as needed for nausea or vomiting. Patient not taking: Reported on 07/26/2019 05/22/16   Harvest Dark, MD  ondansetron (ZOFRAN) 4 MG tablet Take 1  tablet (4 mg total) by mouth every 8 (eight) hours as needed for up to 10 doses for nausea or vomiting. 09/14/19   Lucrezia Starch, MD  pantoprazole (PROTONIX) 40 MG tablet Take 40 mg by mouth daily.    [provider]  polyethylene glycol powder (GLYCOLAX/MIRALAX) powder TAKE 255 G BY MOUTH ONCE DAILY FOR 1 DAY. TAKE AS DIRECTED FOR COLONOSCOPY. 06/13/16   [provider]  Pregabalin (LYRICA PO) Take by mouth.    [provider]  topiramate (TOPAMAX) 50 MG tablet TAKE 1 TABLET BY MOUTH EVERY DAY 09/04/18   [provider]    Allergies Hydrocodone  Family History  Problem Relation Age of Onset  . Diabetes Mother   . Hypertension Mother   . Hypertension Father     Social History Social History   Tobacco Use  . Smoking status: Current Some Day Smoker    Types: Cigars  . Smokeless tobacco: Former Network engineer  . Vaping Use: Never used  Substance Use Topics  . Alcohol use: No    Comment: rare  . Drug use: No    Review of Systems  Review of Systems  Constitutional: Negative for chills and fever.  HENT: Negative for sore throat.   Eyes: Negative for pain.  Respiratory: Positive for cough. Negative for stridor.   Cardiovascular: Negative for chest pain.  Gastrointestinal: Positive for diarrhea, nausea and vomiting.  Skin: Negative for rash.  Neurological: Negative for seizures, loss of consciousness and headaches.  Psychiatric/Behavioral: Negative for suicidal ideas.  All other systems reviewed and are negative.     ____________________________________________   PHYSICAL EXAM:  VITAL SIGNS: ED Triage Vitals  Enc Vitals Group     BP 09/14/19 1014 (!) 144/86     Pulse Rate 09/14/19 1014 81     Resp 09/14/19 1014 20     Temp 09/14/19 1014 99 F (37.2 C)     Temp Source 09/14/19 1014 Oral     SpO2 09/14/19 1014 99 %     Weight 09/14/19 1015 186 lb (84.4 kg)     Height 09/14/19 1015 5\' 8"  (1.727 m)     Head Circumference --       Peak Flow --      Pain Score 09/14/19 1015 0     Pain Loc --      Pain Edu? --      Excl. in [place]? --    Vitals:   09/14/19 1538 09/14/19 1600  BP: (!) 153/89 (!) 146/94  Pulse: 82 71  Resp:    Temp:    SpO2: 99% 100%   Physical Exam Vitals and nursing note reviewed.  Constitutional:      Appearance: He is well-developed.  HENT:     Head: Normocephalic and atraumatic.     Right Ear: External ear normal.     Left Ear: External ear normal.     Nose: Nose normal.  Eyes:     Conjunctiva/sclera: Conjunctivae normal.  Cardiovascular:     Rate and Rhythm: Normal  rate and regular rhythm.     Heart sounds: No murmur heard.   Pulmonary:     Effort: Pulmonary effort is normal. No respiratory distress.     Breath sounds: Normal breath sounds.  Abdominal:     Palpations: Abdomen is soft.     Tenderness: There is no abdominal tenderness.  Musculoskeletal:     Cervical back: Neck supple.     Right lower leg: No edema.     Left lower leg: No edema.  Skin:    General: Skin is warm and dry.     Capillary Refill: Capillary refill takes less than 2 seconds.  Neurological:     General: No focal deficit present.     Mental Status: He is alert.      ____________________________________________   LABS (all labs ordered are listed, but only abnormal results are displayed)  Labs Reviewed  LIPASE, BLOOD - Abnormal; Notable for the following components:      Result Value   Lipase 68 (*)    All other components within normal limits  COMPREHENSIVE METABOLIC PANEL - Abnormal; Notable for the following components:   Glucose, Bld 123 (*)    All other components within normal limits  CBC - Abnormal; Notable for the following components:   Hemoglobin 8.5 (*)    HCT 30.5 (*)    MCV 70.4 (*)    MCH 19.6 (*)    MCHC 27.9 (*)    RDW 18.9 (*)    Platelets 482 (*)    All other components within normal limits  URINALYSIS, COMPLETE (UACMP) WITH MICROSCOPIC - Abnormal; Notable for the  following components:   Color, Urine YELLOW (*)    APPearance CLEAR (*)    All other components within normal limits  SARS CORONAVIRUS 2 BY RT PCR (HOSPITAL ORDER, [facility])   ____________________________________________  ______________________________________  [place]   Official radiology report(s): DG Chest 1 View  Result Date: 09/14/2019 CLINICAL DATA:  Sore throat, productive cough, and N/V/D x 3 weeks. Hx of COPD, DM, HTN, current smoker. cough EXAM: CHEST  1 VIEW COMPARISON:  07/23/2016 FINDINGS: Normal mediastinum and cardiac silhouette. Normal pulmonary vasculature. No evidence of effusion, infiltrate, or pneumothorax. No acute bony abnormality. IMPRESSION: No acute cardiopulmonary process. Electronically Signed   By: Suzy Bouchard M.D.   On: 09/14/2019 14:51   CT ABDOMEN PELVIS W CONTRAST  Result Date: 09/14/2019 CLINICAL DATA:  Diverticulitis suspected EXAM: CT ABDOMEN AND PELVIS WITH CONTRAST TECHNIQUE: Multidetector CT imaging of the abdomen and pelvis was performed using the standard protocol following bolus administration of intravenous contrast. CONTRAST:  178mL OMNIPAQUE IOHEXOL 300 MG/ML  SOLN COMPARISON:  07/23/2016 FINDINGS: Lower chest: No acute abnormality. Hepatobiliary: No solid liver abnormality is seen. Hepatic steatosis. No gallstones, gallbladder wall thickening, or biliary dilatation. Pancreas: Unremarkable. No pancreatic ductal dilatation or surrounding inflammatory changes. Spleen: Normal in size without significant abnormality. Adrenals/Urinary Tract: Adrenal glands are unremarkable. Kidneys are normal, without renal calculi, solid lesion, or hydronephrosis. Bladder is unremarkable. Stomach/Bowel: Stomach is within normal limits. Appendix appears normal. There is sigmoid diverticulosis with wall thickening of the distal sigmoid colon (series 2, image 62). There is extensive wall thickening and fatty mural stratification of the cecum  and proximal transverse colon (series 2, image 38). Vascular/Lymphatic: Aortic atherosclerosis. No enlarged abdominal or pelvic lymph nodes. Reproductive: No mass or other significant abnormality. Other: No abdominal wall hernia or abnormality. No abdominopelvic ascites. Musculoskeletal: No acute or significant osseous findings.  IMPRESSION: 1. There is sigmoid diverticulosis with wall thickening of the distal sigmoid colon. 2. There is extensive wall thickening and fatty mural stratification of the cecum and proximal transverse colon. 3. Constellation of findings is potentially consistent with acute diverticulitis, however the background of chronic inflammatory findings suggests other pathology such as ulcerative colitis. 4. No evidence of perforation or abscess. 5. Hepatic steatosis. 6. Aortic Atherosclerosis (ICD10-I70.0). Electronically Signed   By: Eddie Candle M.D.   On: 09/14/2019 15:37    ____________________________________________   PROCEDURES  Procedure(s) performed (including Critical Care):  Procedures   ____________________________________________   INITIAL IMPRESSION / ASSESSMENT AND PLAN / ED COURSE        Overall patient's history, exam, and ED work-up is most consistent with diverticulitis versus ulcerative colitis.  Patient is afebrile hemodynamic stable arrival.  Exam as above.  Chest x-ray without any evidence of pneumonia, pneumothorax, pulmonary edema, or other clear etiology for patient's cough.  CT abdomen pelvis with concerning findings for diverticulitis versus possible ulcerative colitis disease.  No evidence of acute pancreatitis, pyelonephritis, or significant metabolic arrangement.  Patient is noted to be anemic and I discussed this with the patient he noted his PCP told him he was anemic from his hemorrhoids.  I informed him that he appeared to have a significant drop in his hemoglobin and he should discuss this with his PCP and possibly starting iron  supplementation from his PCP.  Rx written for Augmentin and Zofran.          ____________________________________________   FINAL CLINICAL IMPRESSION(S) / ED DIAGNOSES  Final diagnoses:  Low hemoglobin  Diverticulitis     ED Discharge Orders         Ordered    amoxicillin-clavulanate (AUGMENTIN) 875-125 MG tablet  2 times daily     Discontinue  Reprint     09/14/19 1603    ondansetron (ZOFRAN) 4 MG tablet  Every 8 hours PRN     Discontinue  Reprint     09/14/19 1608           Note:  This document was prepared using Dragon voice recognition software and may include unintentional dictation errors.   Lucrezia Starch, MD 09/14/19 [phone]

## 2019-09-14 NOTE — ED Notes (Signed)
Pt c/o diarrhea, n/v, sore throat, and productive cough x3 weeks. Pt states diarrhea improved when he stopped taking metformin and believes it may be side effect of medication. Pt is AOX4, NAD noted. Pt denies fever, sob, dizziness. Pt appears slightly clammy.

## 2019-09-24 DIAGNOSIS — R519 Headache, unspecified: Secondary | ICD-10-CM | POA: Insufficient documentation

## 2020-04-27 ENCOUNTER — Observation Stay
Admission: EM | Admit: 2020-04-27 | Discharge: 2020-04-28 | Disposition: A | Discharge status: Home / Self Care | Payer: Medicare Other | Attending: Hospitalist | Admitting: Hospitalist

## 2020-04-27 ENCOUNTER — Other Ambulatory Visit: Payer: Self-pay

## 2020-04-27 ENCOUNTER — Encounter: Payer: Self-pay | Admitting: Emergency Medicine

## 2020-04-27 DIAGNOSIS — F17290 Nicotine dependence, other tobacco product, uncomplicated: Secondary | ICD-10-CM | POA: Diagnosis not present

## 2020-04-27 DIAGNOSIS — K219 Gastro-esophageal reflux disease without esophagitis: Secondary | ICD-10-CM | POA: Diagnosis present

## 2020-04-27 DIAGNOSIS — Z7984 Long term (current) use of oral hypoglycemic drugs: Secondary | ICD-10-CM | POA: Insufficient documentation

## 2020-04-27 DIAGNOSIS — I10 Essential (primary) hypertension: Secondary | ICD-10-CM | POA: Diagnosis not present

## 2020-04-27 DIAGNOSIS — E1165 Type 2 diabetes mellitus with hyperglycemia: Principal | ICD-10-CM

## 2020-04-27 DIAGNOSIS — Z9989 Dependence on other enabling machines and devices: Secondary | ICD-10-CM

## 2020-04-27 DIAGNOSIS — G4733 Obstructive sleep apnea (adult) (pediatric): Secondary | ICD-10-CM

## 2020-04-27 DIAGNOSIS — E861 Hypovolemia: Secondary | ICD-10-CM

## 2020-04-27 DIAGNOSIS — Z8719 Personal history of other diseases of the digestive system: Secondary | ICD-10-CM

## 2020-04-27 DIAGNOSIS — E871 Hypo-osmolality and hyponatremia: Secondary | ICD-10-CM

## 2020-04-27 DIAGNOSIS — N179 Acute kidney failure, unspecified: Secondary | ICD-10-CM

## 2020-04-27 DIAGNOSIS — Z79899 Other long term (current) drug therapy: Secondary | ICD-10-CM | POA: Insufficient documentation

## 2020-04-27 DIAGNOSIS — J449 Chronic obstructive pulmonary disease, unspecified: Secondary | ICD-10-CM | POA: Diagnosis not present

## 2020-04-27 DIAGNOSIS — R739 Hyperglycemia, unspecified: Secondary | ICD-10-CM

## 2020-04-27 DIAGNOSIS — I959 Hypotension, unspecified: Secondary | ICD-10-CM

## 2020-04-27 DIAGNOSIS — F419 Anxiety disorder, unspecified: Secondary | ICD-10-CM | POA: Diagnosis present

## 2020-04-27 DIAGNOSIS — D649 Anemia, unspecified: Secondary | ICD-10-CM | POA: Diagnosis not present

## 2020-04-27 DIAGNOSIS — I9589 Other hypotension: Secondary | ICD-10-CM | POA: Diagnosis not present

## 2020-04-27 LAB — BASIC METABOLIC PANEL
Anion gap: 7 (ref 5–15)
BUN: 25 mg/dL — ABNORMAL HIGH (ref 6–20)
CO2: 24 mmol/L (ref 22–32)
Calcium: 9.4 mg/dL (ref 8.9–10.3)
Chloride: 98 mmol/L (ref 98–111)
Creatinine, Ser: 1.66 mg/dL — ABNORMAL HIGH (ref 0.61–1.24)
GFR, Estimated: 50 mL/min — ABNORMAL LOW (ref >60)
Glucose, Bld: 511 mg/dL (ref 70–99)
Potassium: 4.4 mmol/L (ref 3.5–5.1)
Sodium: 129 mmol/L — ABNORMAL LOW (ref 135–145)

## 2020-04-27 LAB — CBC
HCT: 28.3 % — ABNORMAL LOW (ref 39.0–52.0)
Hemoglobin: 7.7 g/dL — ABNORMAL LOW (ref 13.0–17.0)
MCH: 19.7 pg — ABNORMAL LOW (ref 26.0–34.0)
MCHC: 27.2 g/dL — ABNORMAL LOW (ref 30.0–36.0)
MCV: 72.4 fL — ABNORMAL LOW (ref 80.0–100.0)
Platelets: 596 10*3/uL — ABNORMAL HIGH (ref 150–400)
RBC: 3.91 MIL/uL — ABNORMAL LOW (ref 4.22–5.81)
RDW: 17.2 % — ABNORMAL HIGH (ref 11.5–15.5)
WBC: 8.4 10*3/uL (ref 4.0–10.5)
nRBC: 0 % (ref 0.0–0.2)

## 2020-04-27 LAB — CBG MONITORING, ED
Glucose-Capillary: 471 mg/dL — ABNORMAL HIGH (ref 70–99)
Glucose-Capillary: 321 mg/dL — ABNORMAL HIGH (ref 70–99)

## 2020-04-27 MED ORDER — SODIUM CHLORIDE 0.9 % IV SOLN: 10.0000 mL/h | Freq: Once | INTRAVENOUS | Status: DC

## 2020-04-27 MED ORDER — ONDANSETRON HCL 4 MG PO TABS: 4.0000 mg | ORAL_TABLET | Freq: Four times a day (QID) | ORAL | Status: DC | PRN

## 2020-04-27 MED ORDER — ACETAMINOPHEN 650 MG RE SUPP: 650.0000 mg | Freq: Four times a day (QID) | RECTAL | Status: DC | PRN

## 2020-04-27 MED ORDER — INSULIN ASPART 100 UNIT/ML ~~LOC~~ SOLN
0.0000 [IU] | Freq: Three times a day (TID) | SUBCUTANEOUS | Status: DC
  Administered 2020-04-28: 5 [IU] via SUBCUTANEOUS
  Administered 2020-04-28: 11 [IU] via SUBCUTANEOUS
  Filled 2020-04-27 (×2): qty 1

## 2020-04-27 MED ORDER — ONDANSETRON HCL 4 MG/2ML IJ SOLN: 4.0000 mg | Freq: Four times a day (QID) | INTRAMUSCULAR | Status: DC | PRN

## 2020-04-27 MED ORDER — SODIUM CHLORIDE 0.9 % IV SOLN: INTRAVENOUS | Status: DC

## 2020-04-27 MED ORDER — ACETAMINOPHEN 325 MG PO TABS: 650.0000 mg | ORAL_TABLET | Freq: Four times a day (QID) | ORAL | Status: DC | PRN

## 2020-04-27 MED ORDER — SODIUM CHLORIDE 0.9% IV SOLUTION
Freq: Once | INTRAVENOUS | Status: DC
  Filled 2020-04-27: qty 250

## 2020-04-27 MED ORDER — SODIUM CHLORIDE 0.9 % IV BOLUS
1000.0000 mL | Freq: Once | INTRAVENOUS | Status: AC
  Administered 2020-04-27: 1000 mL via INTRAVENOUS

## 2020-04-27 MED ORDER — HYDROCODONE-ACETAMINOPHEN 5-325 MG PO TABS: 1.0000 | ORAL_TABLET | ORAL | Status: DC | PRN

## 2020-04-27 NOTE — ED Notes (Signed)
Pt c/o dark red rectal bleeding x 1 year, intermittently.  Pt reports dizziness, SOB with exertion, and "pressure behind the eyes" x 2 days. Denies increased bleeding or abdominal pain. Hx of hemorrhoids.

## 2020-04-27 NOTE — ED Provider Notes (Signed)
[facility] Emergency Department Provider Note  ____________________________________________   Event Date/Time   First MD Initiated Contact with Patient 04/27/20 2024     (approximate)  I have reviewed the triage vital signs and the nursing notes.   HISTORY  Chief Complaint Hyperglycemia and Hypotension    HPI [name] is a 50 y.o. male with anxiety, diabetes, hypertension, COPD who comes in with elevated sugars.  Patient was taken to primary care doctor and was told that his sugars were high and his blood pressures were low so sent to the ER.  Patient states that he has been on insulin for about 1 week now.  He states that he takes 15 units at nighttime only.  He states that he has been compliant with this but his sugars have been still running in the 400s at breakfast lunch and dinner.  He went to his primary care doctor today who recommended him go up to 25 units instead.  He states that because his blood pressure was low and he is feeling lightheaded they told him to come in to the ER.  Patient reports being lightheaded for the past couple days, worse with standing up, better at rest.  Denies any chest pain or shortness of breath or leg swelling associated with it.  Patient is on Metformin 500 twice a day          Past Medical History:  Diagnosis Date  . Anxiety   . Chronic back pain   . Chronic neck pain   . COPD (chronic obstructive pulmonary disease) ([place])   . Depression   . Diabetes mellitus without complication ([place])   . Enlarged heart    pt report  . Enlarged kidney Left  . GERD (gastroesophageal reflux disease)   . Headache, migraine   . Hypertension   . Sleep apnea     Patient Active Problem List   Diagnosis Date Noted  . Hypogonadism, male 07/29/2019  . Sepsis ([place]) 07/23/2016  . Bloody diarrhea 07/23/2016  . Diabetes ([place]) 07/23/2016  . HTN (hypertension) 07/23/2016  . Anxiety 07/23/2016  . GERD (gastroesophageal reflux  disease) 07/23/2016  . Chest pain 04/08/2016  . Blood in the stool 03/29/2016  . Bilateral carpal tunnel syndrome 03/21/2016  . Chronic bilateral low back pain without sciatica 01/12/2016  . Numbness and tingling 01/12/2016  . Anxiety, generalized 12/01/2015  . Difficulty sleeping 12/01/2015  . Migraine without aura and without status migrainosus, not intractable 12/01/2015    Past Surgical History:  Procedure Laterality Date  . CIRCUMCISION    . COLONOSCOPY WITH PROPOFOL N/A 11/01/2016   Procedure: COLONOSCOPY WITH PROPOFOL;  Surgeon: [name], MD;  Location: [facility] ENDOSCOPY;  Service: Gastroenterology;  Laterality: N/A;  . COLONOSCOPY WITH PROPOFOL N/A 09/14/2018   Procedure: COLONOSCOPY WITH PROPOFOL;  Surgeon: [name], MD;  Location: [facility] ENDOSCOPY;  Service: Gastroenterology;  Laterality: N/A;    Prior to Admission medications   Medication Sig Start Date End Date Taking? Authorizing Provider  albuterol (PROVENTIL HFA;VENTOLIN HFA) 108 (90 Base) MCG/ACT inhaler Inhale into the lungs every 6 (six) hours as needed for wheezing or shortness of breath.    [provider]  beclomethasone (QVAR) 80 MCG/ACT inhaler Inhale into the lungs 2 (two) times daily.    [provider]  hyoscyamine (LEVSIN SL) 0.125 MG SL tablet Place 0.125 mg under the tongue every 4 (four) hours as needed.    [provider]  lisinopril (PRINIVIL,ZESTRIL) 40 MG tablet Take  40 mg by mouth daily.    [provider]  metFORMIN (GLUCOPHAGE) 500 MG tablet Take by mouth 2 (two) times daily with a meal.    [provider]  metoprolol tartrate (LOPRESSOR) 25 MG tablet Take 25 mg by mouth 2 (two) times daily.    [provider]  nortriptyline (PAMELOR) 50 MG capsule Take 50 mg by mouth at bedtime.    [provider]  ondansetron (ZOFRAN ODT) 4 MG disintegrating tablet Take 1 tablet (4 mg total) by mouth every 8 (eight) hours as needed for nausea or  vomiting. Patient not taking: Reported on 07/26/2019 05/22/16   Harvest Dark, MD  ondansetron (ZOFRAN) 4 MG tablet Take 1 tablet (4 mg total) by mouth every 8 (eight) hours as needed for up to 10 doses for nausea or vomiting. 09/14/19   Lucrezia Starch, MD  pantoprazole (PROTONIX) 40 MG tablet Take 40 mg by mouth daily.    [provider]  polyethylene glycol powder (GLYCOLAX/MIRALAX) powder TAKE 255 G BY MOUTH ONCE DAILY FOR 1 DAY. TAKE AS DIRECTED FOR COLONOSCOPY. 06/13/16   [provider]  Pregabalin (LYRICA PO) Take by mouth.    [provider]  topiramate (TOPAMAX) 50 MG tablet TAKE 1 TABLET BY MOUTH EVERY DAY 09/04/18   [provider]    Allergies Hydrocodone  Family History  Problem Relation Age of Onset  . Diabetes Mother   . Hypertension Mother   . Hypertension Father     Social History Social History   Tobacco Use  . Smoking status: Current Some Day Smoker    Types: Cigars  . Smokeless tobacco: Former Network engineer  . Vaping Use: Never used  Substance Use Topics  . Alcohol use: No    Comment: rare  . Drug use: No      Review of Systems Constitutional: No fever/chills , lightheaded dizzy+ Eyes: No visual changes. ENT: No sore throat. Cardiovascular: Denies chest pain. Respiratory: Denies shortness of breath. Gastrointestinal: No abdominal pain.  No nausea, no vomiting.  No diarrhea.  No constipation. Genitourinary: Negative for dysuria. Musculoskeletal: Negative for back pain. Skin: Negative for rash. Neurological: Negative for headaches, focal weakness or numbness. All other ROS negative ____________________________________________   PHYSICAL EXAM:  VITAL SIGNS: ED Triage Vitals [04/27/20 1703]  Enc Vitals Group     BP (!) 95/56     Pulse Rate 79     Resp 20     Temp 98.4 F (36.9 C)     Temp Source Oral     SpO2 100 %     Weight 169 lb (76.7 kg)     Height 5\' 8"  (1.727 m)     Head Circumference       Peak Flow      Pain Score 0     Pain Loc      Pain Edu?      Excl. in [place]?     Constitutional: Alert and oriented. Well appearing and in no acute distress curled up in bed with blanket on him. Eyes: Conjunctivae are normal. EOMI. Head: Atraumatic. Nose: No congestion/rhinnorhea. Mouth/Throat: Mucous membranes are moist.   Neck: No stridor. Trachea Midline. FROM Cardiovascular: Normal rate, regular rhythm. Grossly normal heart sounds.  Good peripheral circulation. Respiratory: Normal respiratory effort.  No retractions. Lungs CTAB. Gastrointestinal: Soft and nontender. No distention. No abdominal bruits.  Musculoskeletal: No lower extremity tenderness nor edema.  No joint effusions. Neurologic:  Normal speech and language.  No gross focal neurologic deficits are appreciated.  Skin:  Skin is warm, dry and intact. No rash noted. Psychiatric: Mood and affect are normal. Speech and behavior are normal. GU: no stool in vault   ____________________________________________   LABS (all labs ordered are listed, but only abnormal results are displayed)  Labs Reviewed  BASIC METABOLIC PANEL - Abnormal; Notable for the following components:      Result Value   Sodium 129 (*)    Glucose, Bld 511 (*)    BUN 25 (*)    Creatinine, Ser 1.66 (*)    GFR, Estimated 50 (*)    All other components within normal limits  CBC - Abnormal; Notable for the following components:   RBC 3.91 (*)    Hemoglobin 7.7 (*)    HCT 28.3 (*)    MCV 72.4 (*)    MCH 19.7 (*)    MCHC 27.2 (*)    RDW 17.2 (*)    Platelets 596 (*)    All other components within normal limits  CBG MONITORING, ED - Abnormal; Notable for the following components:   Glucose-Capillary 471 (*)    All other components within normal limits  URINALYSIS, COMPLETE (UACMP) WITH MICROSCOPIC  CBG MONITORING, ED   ____________________________________________   ED ECG REPORT I, Vanessa [place], the attending physician, personally viewed and  interpreted this ECG.  Normal sinus , no st elevation  t wave inversion 3, AVF, normal intervals.  ____________________________________________     PROCEDURES  Procedure(s) performed (including Critical Care):  .Critical Care Performed by: Vanessa [place], MD Authorized by: Vanessa [place], MD   Critical care provider statement:    Critical care time (minutes):  45   Critical care was necessary to treat or prevent imminent or life-threatening deterioration of the following conditions: anemia requiring blood transfusion.   Critical care was time spent personally by me on the following activities:  Discussions with consultants, evaluation of patient's response to treatment, examination of patient, ordering and performing treatments and interventions, ordering and review of laboratory studies, ordering and review of radiographic studies, pulse oximetry, re-evaluation of patient's condition, obtaining history from patient or surrogate and review of old charts     ____________________________________________   INITIAL IMPRESSION / [place] / ED COURSE  Jhaden Pizzuto was evaluated in Emergency Department on 04/27/2020 for the symptoms described in the history of present illness. He was evaluated in the context of the global COVID-19 pandemic, which necessitated consideration that the patient might be at risk for infection with the SARS-CoV-2 virus that causes COVID-19. Institutional protocols and algorithms that pertain to the evaluation of patients at risk for COVID-19 are in a state of rapid change based on information released by regulatory bodies including the CDC and federal and state organizations. These policies and algorithms were followed during the patient's care in the ED.    Pt  Comes in for low blood pressure and high sugar. Labs to evaluate for electrolyte abnormality, AKI, DKA, anemia.  Labs show no evidence of DKA. Pt getting 2L fluids. However, pt noted to have low  hemoglobin. Hard to say if symptoms are from low hemoglobin vs high sugar.  Discussed benefits and risks of blood and pt okay with transfusion. Pt does report some BRBPR intermittently. Thinks he has hemorrhoids.  Rectal exam with no stool in the vault to test.  Given this will d/w hospital team for admissi0on.         ____________________________________________  FINAL CLINICAL IMPRESSION(S) / ED DIAGNOSES   Final diagnoses:  Symptomatic anemia  AKI (acute kidney injury) ([place])  Hyperglycemia      MEDICATIONS GIVEN DURING THIS VISIT:  Medications  0.9 %  sodium chloride infusion (10 mL/hr Intravenous Not Given 04/28/20 0311)  0.9 %  sodium chloride infusion (Manually program via Guardrails IV Fluids) ( Intravenous Not Given 04/28/20 0328)  insulin aspart (novoLOG) injection 0-15 Units (11 Units Subcutaneous Given 04/28/20 0959)  0.9 %  sodium chloride infusion ( Intravenous New Bag/Given 04/28/20 0328)  acetaminophen (TYLENOL) tablet 650 mg (has no administration in time range)    Or  acetaminophen (TYLENOL) suppository 650 mg (has no administration in time range)  HYDROcodone-acetaminophen (NORCO/VICODIN) 5-325 MG per tablet 1-2 tablet (has no administration in time range)  ondansetron (ZOFRAN) tablet 4 mg (has no administration in time range)    Or  ondansetron (ZOFRAN) injection 4 mg (has no administration in time range)  sodium chloride 0.9 % bolus 1,000 mL (1,000 mLs Intravenous Bolus 04/27/20 1716)  sodium chloride 0.9 % bolus 1,000 mL (0 mLs Intravenous Stopped 04/28/20 0233)     ED Discharge Orders    None       Note:  This document was prepared using Dragon voice recognition software and may include unintentional dictation errors.   Vanessa [place], MD 04/28/20 1128

## 2020-04-27 NOTE — H&P (Addendum)
History and Physical    [name] HQP:[id number] DOB: Sep 28, 1970 DOA: 04/27/2020  PCP: Theotis Burrow, MD   Patient coming from: Home  I have personally briefly reviewed patient's old medical records in [place]  Chief Complaint: Dizziness, high blood sugar, low blood pressure sent from PCP  HPI: [name] is a 50 y.o. male with medical history significant for COPD, DM recently started on insulin, HTN, anxiety, GERD, OSA, lower GI bleed in 2020 with colonoscopy showing 3 sessile polyps, diverticulosis and internal hemorrhoids, who was sent to the emergency room by his PCP due to uncontrolled blood sugars, dizziness and hypotension when evaluated in his PCPs office earlier today.  Patient was started on basal insulin a week prior blood sugars have been running in the 400s and he has had polyuria.  States he was otherwise feeling his usual self until about 2 days ago when he developed lightheadedness and generalized malaise.  He denies chest pain or shortness of breath.  Denies cough, fever or chills.  Denies nausea, vomiting or diarrhea.  Denies headache, visual disturbance or one-sided weakness numbness or tingling.  Patient does state that he has regular hemorrhoidal bleeding since his colonoscopy in 2020 sometimes for several days to weeks and might let up for a month only to continue to recur.  BP after doctor's office was 78/52. ED course: On arrival, BP 95/56, pulse 79, respirations 20 with O2 sat 100% on room air and temperature 98.4 blood work significant for hemoglobin of 7.7, down from 8.5 about 3 months prior.  WBC normal.  Blood glucose 511, sodium 129, anion gap normal.  Creatinine 1.66 up from baseline of 1.04.  COVID, flu and urinalysis all pending.  No stool in rectal vault for occult blood testing in the ER. EKG as reviewed by me: Normal sinus rhythm at 77.  T wave inversion D2-D3 aVF, borderline criteria for LVH  Patient given IV fluid boluses x2 L and  started on 1 unit PRBC.  Hospitalist consulted for admission.    Review of Systems: As per HPI otherwise all other systems on review of systems negative.    Past Medical History:  Diagnosis Date   Anxiety    Chronic back pain    Chronic neck pain    COPD (chronic obstructive pulmonary disease) (HCC)    Depression    Diabetes mellitus without complication ([place])    Enlarged heart    pt report   Enlarged kidney Left   GERD (gastroesophageal reflux disease)    Headache, migraine    Hypertension    Sleep apnea     Past Surgical History:  Procedure Laterality Date   CIRCUMCISION     COLONOSCOPY WITH PROPOFOL N/A 11/01/2016   Procedure: COLONOSCOPY WITH PROPOFOL;  Surgeon: [name], MD;  Location: [facility] ENDOSCOPY;  Service: Gastroenterology;  Laterality: N/A;   COLONOSCOPY WITH PROPOFOL N/A 09/14/2018   Procedure: COLONOSCOPY WITH PROPOFOL;  Surgeon: [name], MD;  Location: [facility] ENDOSCOPY;  Service: Gastroenterology;  Laterality: N/A;     reports that he has been smoking cigars. He has quit using smokeless tobacco. He reports that he does not drink alcohol and does not use drugs.  Allergies  Allergen Reactions   Hydrocodone Other (See Comments)    Constipation    Family History  Problem Relation Age of Onset   Diabetes Mother    Hypertension Mother    Hypertension Father       Prior to Admission medications  Medication Sig Start Date End Date Taking? Authorizing Provider  albuterol (PROVENTIL HFA;VENTOLIN HFA) 108 (90 Base) MCG/ACT inhaler Inhale into the lungs every 6 (six) hours as needed for wheezing or shortness of breath.    [provider]  beclomethasone (QVAR) 80 MCG/ACT inhaler Inhale into the lungs 2 (two) times daily.    [provider]  hyoscyamine (LEVSIN SL) 0.125 MG SL tablet Place 0.125 mg under the tongue every 4 (four) hours as needed.    [provider]  lisinopril (PRINIVIL,ZESTRIL) 40 MG tablet Take  40 mg by mouth daily.    [provider]  metFORMIN (GLUCOPHAGE) 500 MG tablet Take by mouth 2 (two) times daily with a meal.    [provider]  metoprolol tartrate (LOPRESSOR) 25 MG tablet Take 25 mg by mouth 2 (two) times daily.    [provider]  nortriptyline (PAMELOR) 50 MG capsule Take 50 mg by mouth at bedtime.    [provider]  ondansetron (ZOFRAN ODT) 4 MG disintegrating tablet Take 1 tablet (4 mg total) by mouth every 8 (eight) hours as needed for nausea or vomiting. Patient not taking: Reported on 07/26/2019 05/22/16   Harvest Dark, MD  ondansetron (ZOFRAN) 4 MG tablet Take 1 tablet (4 mg total) by mouth every 8 (eight) hours as needed for up to 10 doses for nausea or vomiting. 09/14/19   Lucrezia Starch, MD  pantoprazole (PROTONIX) 40 MG tablet Take 40 mg by mouth daily.    [provider]  polyethylene glycol powder (GLYCOLAX/MIRALAX) powder TAKE 255 G BY MOUTH ONCE DAILY FOR 1 DAY. TAKE AS DIRECTED FOR COLONOSCOPY. 06/13/16   [provider]  Pregabalin (LYRICA PO) Take by mouth.    [provider]  topiramate (TOPAMAX) 50 MG tablet TAKE 1 TABLET BY MOUTH EVERY DAY 09/04/18   [provider]    Physical Exam: Vitals:   04/27/20 1703  BP: (!) 95/56  Pulse: 79  Resp: 20  Temp: 98.4 F (36.9 C)  TempSrc: Oral  SpO2: 100%  Weight: 76.7 kg  Height: 5\' 8"  (1.727 m)     Vitals:   04/27/20 1703  BP: (!) 95/56  Pulse: 79  Resp: 20  Temp: 98.4 F (36.9 C)  TempSrc: Oral  SpO2: 100%  Weight: 76.7 kg  Height: 5\' 8"  (1.727 m)      Constitutional:  Somewhat frail-appearing and oriented x 3 . Not in any apparent distress HEENT:      Head: Normocephalic and atraumatic.         Eyes: PERLA, EOMI, Conjunctivae are normal. Sclera is non-icteric.       Mouth/Throat: Mucous membranes are moist.       Neck: Supple with no signs of meningismus. Cardiovascular: Regular rate and rhythm. No murmurs,  gallops, or rubs. 2+ symmetrical distal pulses are present . No JVD. No LE edema Respiratory: Respiratory effort normal .Lungs sounds clear bilaterally. No wheezes, crackles, or rhonchi.  Gastrointestinal: Soft, non tender, and non distended with positive bowel sounds.  Genitourinary: No CVA tenderness. Musculoskeletal: Nontender with normal range of motion in all extremities. No cyanosis, or erythema of extremities. Neurologic:  Face is symmetric. Moving all extremities. No gross focal neurologic deficits . Skin: Skin is warm, dry.  No rash or ulcers Psychiatric: Mood and affect are normal    Labs on Admission: I have personally reviewed following labs and imaging studies  CBC: Recent Labs  Lab 04/27/20 1707  WBC 8.4  HGB  7.7*  HCT 28.3*  MCV 72.4*  PLT 160*   Basic Metabolic Panel: Recent Labs  Lab 04/27/20 1707  NA 129*  K 4.4  CL 98  CO2 24  GLUCOSE 511*  BUN 25*  CREATININE 1.66*  CALCIUM 9.4   GFR: Estimated Creatinine Clearance: 50.9 mL/min (A) (by C-G formula based on SCr of 1.66 mg/dL (H)). Liver Function Tests: No results for input(s): AST, ALT, ALKPHOS, BILITOT, PROT, ALBUMIN in the last 168 hours. No results for input(s): LIPASE, AMYLASE in the last 168 hours. No results for input(s): AMMONIA in the last 168 hours. Coagulation Profile: No results for input(s): INR, PROTIME in the last 168 hours. Cardiac Enzymes: No results for input(s): CKTOTAL, CKMB, CKMBINDEX, TROPONINI in the last 168 hours. BNP (last 3 results) No results for input(s): PROBNP in the last 8760 hours. HbA1C: No results for input(s): HGBA1C in the last 72 hours. CBG: Recent Labs  Lab 04/27/20 1709 04/27/20 2212  GLUCAP 471* 321*   Lipid Profile: No results for input(s): CHOL, HDL, LDLCALC, TRIG, CHOLHDL, LDLDIRECT in the last 72 hours. Thyroid Function Tests: No results for input(s): TSH, T4TOTAL, FREET4, T3FREE, THYROIDAB in the last 72 hours. Anemia Panel: No results for  input(s): VITAMINB12, FOLATE, FERRITIN, TIBC, IRON, RETICCTPCT in the last 72 hours. Urine analysis:    Component Value Date/Time   COLORURINE YELLOW (A) 09/14/2019 1017   APPEARANCEUR CLEAR (A) 09/14/2019 1017   APPEARANCEUR Clear 07/11/2013 1133   LABSPEC 1.013 09/14/2019 1017   LABSPEC 1.027 07/11/2013 1133   PHURINE 6.0 09/14/2019 1017   GLUCOSEU NEGATIVE 09/14/2019 1017   GLUCOSEU Negative 07/11/2013 1133   HGBUR NEGATIVE 09/14/2019 1017   BILIRUBINUR NEGATIVE 09/14/2019 1017   BILIRUBINUR Negative 07/11/2013 [place] 09/14/2019 1017   PROTEINUR NEGATIVE 09/14/2019 1017   NITRITE NEGATIVE 09/14/2019 1017   LEUKOCYTESUR NEGATIVE 09/14/2019 1017   LEUKOCYTESUR Negative 07/11/2013 1133    Radiological Exams on Admission: No results found.   Assessment/Plan 50 year old male with history of COPD, DM recently started on basal insulin a week prior, HTN, anxiety, GERD, OSA, lower GI bleed in 2020 with 09/2018 that showed 3 sessile polyps showing 3 sessile polyps, diverticulosisandinternalhemorrhoids, who was sent to the emergency room by his PCP due to uncontrolled blood sugars in the 400s, dizziness and hypotension of 75/52 when evaluated in his PCPs office.    Lightheadedness -Multifactorial and related to hypotension, likely from dehydration related to polyuria from hyperglycemia as well as symptomatic anemia  -Treat each etiology as outlined below  Symptomatic anemia, suspect acute on chronic, suspect GI source likely hemorrhoidal History of GI bleed -Patient presenting with lightheadedness and found to have hemoglobin of 7.7, down from 8.53 months prior -Colonoscopy August 2020 showed polyps, diverticulosis and internal hemorrhoids -Patient admits to ongoing hemorrhoidal bleeding.  Follows with GI -Continue transfusion of PRBC x1 unit started in the emergency room -Trend H&H -Consider GI consult in the a.m. if hemoglobin continues to downtrend -N.p.o. except  for ice chips for now  Hypotension secondary to hypovolemia, Essential hypertension -Secondary to dehydration from polyuria/uncontrolled diabetes -SBP in the 70s in PCPs office -IV hydration, treat anemia -Hold home antihypertensives    Hyperglycemia due to type 2 diabetes mellitus ([place]) -Blood glucose 511 on arrival with normal anion gap, improving to the 300s by admission -IV hydration -Sliding scale insulin coverage and basal insulin -Hold home Metformin due to renal insufficiency -Get A1c -Diabetic educator consult    AKI (acute kidney injury) (  [place]) -Creatinine 1.66 up from baseline 1.04, likely ATN secondary to renal hypoperfusion from dehydration -Continue IV hydration -Monitor renal function and hold nephrotoxins    Hyponatremia -Likely pseudohyponatremia related to hyperglycemia -IV hydration with NS and continue to monitor as blood glucose corrects    Anxiety -Continue home meds    GERD (gastroesophageal reflux disease) -IV Protonix    Obstructive sleep apnea on CPAP -CPAP if desired    COPD (chronic obstructive pulmonary disease) ([place]) -Continue home inhalers.  DuoNebs as needed    DVT prophylaxis: SCDs Code Status: full code  Family Communication:  none  Disposition Plan: Back to previous home environment Consults called: none  Status:At the time of admission, it appears that the appropriate admission status for this patient is INPATIENT. This is judged to be reasonable and necessary in order to provide the required intensity of service to ensure the patient's safety given the presenting symptoms, physical exam findings, and initial radiographic and laboratory data in the context of their  Comorbid conditions.   Patient requires inpatient status due to high intensity of service, high risk for further deterioration and high frequency of surveillance required.   I certify that at the point of admission it is my clinical judgment that the patient will require  inpatient hospital care spanning beyond [place] MD [facility]     04/27/2020, 11:08 PM

## 2020-04-27 NOTE — ED Notes (Signed)
Dr Jari Pigg to bedside to speak with patient about blood and consent e-signed.

## 2020-04-27 NOTE — ED Triage Notes (Signed)
Patient to ER for c/o hyperglycemia and possible dehydration. Patient was seen at PCP today and noted to have low blood pressure (78/52 = lowest at PCP). +Dizziness. Denies any recent illness. +Pressure behind eyes.

## 2020-04-27 NOTE — ED Notes (Signed)
Pt was given meal tray prior to this RN assuming care. Sts he tolerated without nausea.

## 2020-04-28 DIAGNOSIS — N179 Acute kidney failure, unspecified: Secondary | ICD-10-CM | POA: Diagnosis not present

## 2020-04-28 LAB — HIV ANTIBODY (ROUTINE TESTING W REFLEX): HIV Screen 4th Generation wRfx: NONREACTIVE

## 2020-04-28 LAB — URINALYSIS, COMPLETE (UACMP) WITH MICROSCOPIC
Bacteria, UA: NONE SEEN
Bilirubin Urine: NEGATIVE
Glucose, UA: >=500 mg/dL — AB
Hgb urine dipstick: NEGATIVE
Ketones, ur: NEGATIVE mg/dL
Leukocytes,Ua: NEGATIVE
Nitrite: NEGATIVE
Protein, ur: NEGATIVE mg/dL
Specific Gravity, Urine: 1.015 (ref 1.005–1.030)
Squamous Epithelial / HPF: NONE SEEN (ref 0–5)
pH: 5 (ref 5.0–8.0)

## 2020-04-28 LAB — BASIC METABOLIC PANEL
Anion gap: 6 (ref 5–15)
BUN: 13 mg/dL (ref 6–20)
CO2: 22 mmol/L (ref 22–32)
Calcium: 9.1 mg/dL (ref 8.9–10.3)
Chloride: 107 mmol/L (ref 98–111)
Creatinine, Ser: 1.02 mg/dL (ref 0.61–1.24)
GFR, Estimated: >60 mL/min (ref >60)
Glucose, Bld: 307 mg/dL — ABNORMAL HIGH (ref 70–99)
Potassium: 4.1 mmol/L (ref 3.5–5.1)
Sodium: 135 mmol/L (ref 135–145)

## 2020-04-28 LAB — RETICULOCYTES
Immature Retic Fract: 27.5 % — ABNORMAL HIGH (ref 2.3–15.9)
RBC.: 3.55 MIL/uL — ABNORMAL LOW (ref 4.22–5.81)
Retic Count, Absolute: 63.5 10*3/uL (ref 19.0–186.0)
Retic Ct Pct: 1.8 % (ref 0.4–3.1)

## 2020-04-28 LAB — CBC
HCT: 28.3 % — ABNORMAL LOW (ref 39.0–52.0)
Hemoglobin: 8 g/dL — ABNORMAL LOW (ref 13.0–17.0)
MCH: 21.2 pg — ABNORMAL LOW (ref 26.0–34.0)
MCHC: 28.3 g/dL — ABNORMAL LOW (ref 30.0–36.0)
MCV: 74.9 fL — ABNORMAL LOW (ref 80.0–100.0)
Platelets: 427 10*3/uL — ABNORMAL HIGH (ref 150–400)
RBC: 3.78 MIL/uL — ABNORMAL LOW (ref 4.22–5.81)
RDW: 17.7 % — ABNORMAL HIGH (ref 11.5–15.5)
WBC: 7.3 10*3/uL (ref 4.0–10.5)
nRBC: 0 % (ref 0.0–0.2)

## 2020-04-28 LAB — IRON AND TIBC
Iron: 13 ug/dL — ABNORMAL LOW (ref 45–182)
Saturation Ratios: 3 % — ABNORMAL LOW (ref 17.9–39.5)
TIBC: 455 ug/dL — ABNORMAL HIGH (ref 250–450)
UIBC: 442 ug/dL

## 2020-04-28 LAB — MAGNESIUM: Magnesium: 1.9 mg/dL (ref 1.7–2.4)

## 2020-04-28 LAB — FOLATE: Folate: 14.8 ng/mL (ref >5.9)

## 2020-04-28 LAB — VITAMIN B12: Vitamin B-12: 538 pg/mL (ref 180–914)

## 2020-04-28 LAB — HEMOGLOBIN A1C
Hgb A1c MFr Bld: 14.2 % — ABNORMAL HIGH (ref 4.8–5.6)
Mean Plasma Glucose: 360.84 mg/dL

## 2020-04-28 LAB — PREPARE RBC (CROSSMATCH)

## 2020-04-28 LAB — CBG MONITORING, ED
Glucose-Capillary: 313 mg/dL — ABNORMAL HIGH (ref 70–99)
Glucose-Capillary: 202 mg/dL — ABNORMAL HIGH (ref 70–99)

## 2020-04-28 LAB — LACTATE DEHYDROGENASE: LDH: 85 U/L — ABNORMAL LOW (ref 98–192)

## 2020-04-28 LAB — FERRITIN: Ferritin: 2 ng/mL — ABNORMAL LOW (ref 24–336)

## 2020-04-28 MED ORDER — LISINOPRIL 40 MG PO TABS: ORAL_TABLET | ORAL | Status: AC

## 2020-04-28 MED ORDER — IRON 18 MG PO TBCR: 1.0000 | EXTENDED_RELEASE_TABLET | Freq: Every day | ORAL | 0 refills | Status: DC

## 2020-04-28 MED ORDER — INSULIN DETEMIR 100 UNIT/ML ~~LOC~~ SOLN
15.0000 [IU] | Freq: Every day | SUBCUTANEOUS | Status: DC
  Administered 2020-04-28: 15 [IU] via SUBCUTANEOUS
  Filled 2020-04-28: qty 0.15

## 2020-04-28 MED ORDER — METOPROLOL TARTRATE 25 MG PO TABS: ORAL_TABLET | ORAL | Status: AC

## 2020-04-28 NOTE — Discharge Summary (Signed)
Physician Discharge Summary   [name]  male DOB: [birth year]  CMK:[id number]  PCP: Theotis Burrow, MD  Admit date: 04/27/2020 Discharge date: 04/28/2020  Admitted From: home Disposition:  home CODE STATUS: Full code  Discharge Instructions    Discharge instructions   Complete by: As directed    You were dehydrated from your uncontrolled diabetes, which improved from IV fluids.  Please make sure you drink plenty of fluids to remain hydrated.    Please hold your blood pressure medications Lisinopril and Lopressor until followup with your outpatient doctor since you were dehydrated and blood pressure was low.  Continue to take your insulin and metformin as prescribed.  Follow up with your outpatient doctor for further adjustment.  You are very deficient in iron due to your slow bleeding likely from the hemorrhoids.  Please take over-the-counter iron supplement and follow up with GI as outpatient.   Dr. Enzo Bi - -      37 Day Unplanned Readmission Risk Score   Flowsheet [place] [facility] from 04/27/2020 in [facility]  30 Day Unplanned Readmission Risk Score (%) 14.67 Filed at 04/28/2020 0801     This score is the patient's risk of an unplanned readmission within 30 days of being discharged (0 -100%). The score is based on dignosis, age, lab data, medications, orders, and past utilization.   Low:  0-14.9   Medium: 15-21.9   High: 22-29.9   Extreme: 30 and above         Hospital Course:  For full details, please see H&P, progress notes, consult notes and ancillary notes.  Briefly,  [name] is a 50 y.o. male with medical history significant for COPD, DM2 recently started on insulin, HTN, anxiety, GERD, OSA, lower GI bleed in 2020 with colonoscopy showing 3 sessile polyps, diverticulosis and internal hemorrhoids, who was sent to the emergency room by his PCP due to uncontrolled blood sugars, dizziness and hypotension when  evaluated in his PCPs office earlier.    Patient was started on basal insulin a week prior blood sugars have been running in the 400s and he has had polyuria.  States he was otherwise feeling his usual self until about 2 days ago when he developed lightheadedness and generalized malaise.   Lightheadedness -Multifactorial and related to hypotension, likely from dehydration related to polyuria from hyperglycemia as well as symptomatic anemia. -Treated each etiology as outlined below  # Symptomatic anemia, suspect acute on chronic, suspect GI source likely hemorrhoidal # Iron def anemia # History of GI bleed -Patient presenting with lightheadedness and found to have hemoglobin of 7.7, down from 8.53 months prior -Colonoscopy August 2020 showed polyps, diverticulosis and internal hemorrhoids -Patient admits to ongoing hemorrhoidal bleeding.  Follows with GI -Pt received PRBC x1 unit in the emergency room --anemia workup showed severe iron def.  Pt was not on iron supplements prior to presentation, so was advised to start after discharge.  Hypotension secondary to hypovolemia, Essential hypertension -Secondary to dehydration from polyuria/uncontrolled diabetes -SBP in the 70s in PCPs office --Pt received IVF with improvement in BP.  Home Lisinopril and Lopressor held until outpatient followup.    Hyperglycemia due to type 2 diabetes mellitus ([place]) -Blood glucose 511 on arrival with normal anion gap.  Pt received IVF and SSI on admission.  Home basal insulin ordered the next day.  Pt is discharged on home diabetic regimen and advised to follow up with PCP for further adjustment.  AKI (acute kidney injury) ([place]) -Creatinine 1.66 up from baseline 1.04, likely ATN secondary to renal hypoperfusion from dehydration --Cr improved back to baseline 1.02 after IVF.  Pt encourage to keep well hydrate at home.    Hyponatremia -Likely pseudohyponatremia related to hyperglycemia     Anxiety -Continue home meds    GERD (gastroesophageal reflux disease) -cont home PPI    Obstructive sleep apnea on CPAP    COPD (chronic obstructive pulmonary disease) ([place]) -Continue home inhalers.     Discharge Diagnoses:  Active Problems:   HTN (hypertension)   Anxiety   GERD (gastroesophageal reflux disease)   Hyperglycemia due to type 2 diabetes mellitus (HCC)   Symptomatic anemia   AKI (acute kidney injury) ([place])   Hyponatremia   Obstructive sleep apnea on CPAP   COPD (chronic obstructive pulmonary disease) (HCC)   Hypotension   History of GI bleed    Discharge Instructions:  Allergies as of 04/28/2020      Reactions   Hydrocodone Other (See Comments)   Constipation      Medication List    TAKE these medications   FLUoxetine 40 MG capsule Commonly known as: PROZAC Take 40 mg by mouth daily.   gabapentin 300 MG capsule Commonly known as: NEURONTIN Take 300 mg by mouth 3 (three) times daily.   Iron 18 MG Tbcr Take 1 tablet (18 mg total) by mouth daily. Can take any over-the-counter iron supplement.   Levemir FlexTouch 100 UNIT/ML FlexPen Generic drug: insulin detemir Inject 15 Units into the skin at bedtime.   lisinopril 40 MG tablet Commonly known as: ZESTRIL Hold until outpatient followup with your doctor due to dehydration and low blood pressure. What changed:   how much to take  how to take this  when to take this  additional instructions   LYRICA PO Take by mouth.   metFORMIN 500 MG tablet Commonly known as: GLUCOPHAGE Take by mouth 2 (two) times daily with a meal.   metoprolol tartrate 25 MG tablet Commonly known as: LOPRESSOR Hold until outpatient followup with your doctor due to dehydration and low blood pressure. What changed:   how much to take  how to take this  when to take this  additional instructions   ondansetron 4 MG tablet Commonly known as: Zofran Take 1 tablet (4 mg total) by mouth every 8 (eight) hours  as needed for up to 10 doses for nausea or vomiting.   pantoprazole 40 MG tablet Commonly known as: PROTONIX Take 40 mg by mouth daily.   Serevent Diskus 50 MCG/DOSE diskus inhaler Generic drug: salmeterol 1 puff every 12 (twelve) hours.   SUMAtriptan 100 MG tablet Commonly known as: IMITREX 1/2-1 TAB AT HEADACHE ONSET, CAN REPEAT ONCE IN 2 HOURS IF NEEDED NO MORE THAN 2 DOSES IN 24 HOURS.   topiramate 50 MG tablet Commonly known as: TOPAMAX TAKE 1 TABLET BY MOUTH EVERY DAY   traZODone 100 MG tablet Commonly known as: DESYREL Take 100 mg by mouth at bedtime.   cariprazine capsule Commonly known as: VRAYLAR Take 1.5 mg by mouth daily. At bedtime   Vraylar capsule Generic drug: cariprazine Take 3 mg by mouth daily.        Follow-up Information    Revelo, Elyse Jarvis, MD. Schedule an appointment as soon as possible for a visit in 1 week(s).   Specialty: Family Medicine Contact information: [address] [place] [address] [phone]  Allergies  Allergen Reactions  . Hydrocodone Other (See Comments)    Constipation     The results of significant diagnostics from this hospitalization (including imaging, microbiology, ancillary and laboratory) are listed below for reference.   Consultations:   Procedures/Studies: No results found.    Labs: BNP (last 3 results) No results for input(s): BNP in the last 8760 hours. Basic Metabolic Panel: Recent Labs  Lab 04/27/20 1707 04/28/20 1001  NA 129* 135  K 4.4 4.1  CL 98 107  CO2 24 22  GLUCOSE 511* 307*  BUN 25* 13  CREATININE 1.66* 1.02  CALCIUM 9.4 9.1  MG  --  1.9   Liver Function Tests: No results for input(s): AST, ALT, ALKPHOS, BILITOT, PROT, ALBUMIN in the last 168 hours. No results for input(s): LIPASE, AMYLASE in the last 168 hours. No results for input(s): AMMONIA in the last 168 hours. CBC: Recent Labs  Lab 04/27/20 1707 04/28/20 0501  WBC 8.4 7.3   HGB 7.7* 8.0*  HCT 28.3* 28.3*  MCV 72.4* 74.9*  PLT 596* 427*   Cardiac Enzymes: No results for input(s): CKTOTAL, CKMB, CKMBINDEX, TROPONINI in the last 168 hours. BNP: Invalid input(s): POCBNP CBG: Recent Labs  Lab 04/27/20 1709 04/27/20 2212 04/28/20 0926  GLUCAP 471* 321* 313*   D-Dimer No results for input(s): DDIMER in the last 72 hours. Hgb A1c Recent Labs    04/27/20 2323  HGBA1C 14.2*   Lipid Profile No results for input(s): CHOL, HDL, LDLCALC, TRIG, CHOLHDL, LDLDIRECT in the last 72 hours. Thyroid function studies No results for input(s): TSH, T4TOTAL, T3FREE, THYROIDAB in the last 72 hours.  Invalid input(s): FREET3 Anemia work up Recent Labs    04/27/20 2323  VITAMINB12 538  FOLATE 14.8  FERRITIN 2*  TIBC 455*  IRON 13*  RETICCTPCT 1.8   Urinalysis    Component Value Date/Time   COLORURINE STRAW (A) 04/27/2020 0233   APPEARANCEUR CLEAR (A) 04/27/2020 0233   APPEARANCEUR Clear 07/11/2013 1133   LABSPEC 1.015 04/27/2020 0233   LABSPEC 1.027 07/11/2013 1133   PHURINE 5.0 04/27/2020 0233   GLUCOSEU >=500 (A) 04/27/2020 0233   GLUCOSEU Negative 07/11/2013 1133   HGBUR NEGATIVE 04/27/2020 0233   BILIRUBINUR NEGATIVE 04/27/2020 0233   BILIRUBINUR Negative 07/11/2013 [place] 04/27/2020 0233   PROTEINUR NEGATIVE 04/27/2020 0233   NITRITE NEGATIVE 04/27/2020 0233   LEUKOCYTESUR NEGATIVE 04/27/2020 0233   LEUKOCYTESUR Negative 07/11/2013 1133   Sepsis Labs Invalid input(s): PROCALCITONIN,  WBC,  LACTICIDVEN Microbiology No results found for this or any previous visit (from the past 240 hour(s)).   Total time spend on discharging this patient, including the last patient exam, discussing the hospital stay, instructions for ongoing care as it relates to all pertinent caregivers, as well as preparing the medical discharge records, prescriptions, and/or referrals as applicable, is 45 minutes.    Enzo Bi, MD  [facility] 04/28/2020, 11:54 AM

## 2020-04-28 NOTE — Care Management CC44 (Signed)
Condition Code 44 Documentation Completed  Patient Details  Name: Darcel Frane MRN: [id number] Date of Birth: [birth year]   Condition Code 44 given:  Yes Patient signature on Condition Code 44 notice:  Yes Documentation of 2 MD's agreement:  Yes Code 44 added to claim:  Yes    Adelene Amas, [place] 04/28/2020, 10:06 AM

## 2020-04-28 NOTE — Care Management Obs Status (Signed)
[place] NOTIFICATION   Patient Details  Name: Espiridion Supinski MRN: [id number] Date of Birth: 05-02-1970   Medicare Observation Status Notification Given:  Yes    Adelene Amas, [place] 04/28/2020, 10:05 AM

## 2020-04-29 LAB — BPAM RBC
Blood Product Expiration Date: 202204212359
ISSUE DATE / TIME: 202203220256
Unit Type and Rh: 5100

## 2020-04-29 LAB — TYPE AND SCREEN
ABO/RH(D): O POS
Antibody Screen: NEGATIVE
Unit division: 0

## 2020-05-12 ENCOUNTER — Ambulatory Visit: Payer: Medicare Other | Admitting: Surgery

## 2020-05-12 ENCOUNTER — Encounter: Payer: Self-pay | Admitting: *Deleted

## 2020-05-12 ENCOUNTER — Other Ambulatory Visit: Payer: Self-pay

## 2020-05-12 ENCOUNTER — Encounter: Payer: Self-pay | Admitting: Surgery

## 2020-05-12 VITALS — BP 101/64 | HR 89 | Temp 98.7°F | Ht 68.0 in | Wt 173.0 lb

## 2020-05-12 DIAGNOSIS — K649 Unspecified hemorrhoids: Secondary | ICD-10-CM

## 2020-05-12 NOTE — Progress Notes (Signed)
Patient ID: [name], male   DOB: 1970/06/12, 50 y.o.   MRN: [id number]  Chief Complaint: Internal hemorrhoids.  History of Present Illness [name] is a 50 y.o. male with 1-1/2 to 2-year history of internal hemorrhoids initially noted on colonoscopy.  He reports a history of thrombosed hemorrhoids, but more so having had dark red blood mixed with stool intermittent on occasion.  He denies any history of constipation.  Denies any history of diarrhea.  Denies any burning/itching or pain.  His last colonoscopy was August 2020 when these were noted.  There is also a hemoglobin at that time at 8.5.  He was recently in the ER for anemia and received a blood transfusion.  Has had no active hemorrhoidal bleeding since.  Past Medical History Past Medical History:  Diagnosis Date  . Anxiety   . Chronic back pain   . Chronic neck pain   . COPD (chronic obstructive pulmonary disease) ([place])   . Depression   . Diabetes mellitus without complication ([place])   . Enlarged heart    pt report  . Enlarged kidney Left  . GERD (gastroesophageal reflux disease)   . Headache, migraine   . Hypertension   . Sleep apnea       Past Surgical History:  Procedure Laterality Date  . CIRCUMCISION    . COLONOSCOPY WITH PROPOFOL N/A 11/01/2016   Procedure: COLONOSCOPY WITH PROPOFOL;  Surgeon: [name], MD;  Location: [facility] ENDOSCOPY;  Service: Gastroenterology;  Laterality: N/A;  . COLONOSCOPY WITH PROPOFOL N/A 09/14/2018   Procedure: COLONOSCOPY WITH PROPOFOL;  Surgeon: [name], MD;  Location: [facility] ENDOSCOPY;  Service: Gastroenterology;  Laterality: N/A;    Allergies  Allergen Reactions  . Hydrocodone Other (See Comments)    Constipation    Current Outpatient Medications  Medication Sig Dispense Refill  . cariprazine (VRAYLAR) capsule Take 1.5 mg by mouth daily. At bedtime    . FLUoxetine (PROZAC) 40 MG capsule Take 40 mg by mouth daily.    Marland Kitchen gabapentin (NEURONTIN) 300 MG capsule Take 300 mg by  mouth 3 (three) times daily.    Marland Kitchen LEVEMIR FLEXTOUCH 100 UNIT/ML FlexPen Inject 15 Units into the skin at bedtime.    Marland Kitchen lisinopril (ZESTRIL) 40 MG tablet Hold until outpatient followup with your doctor due to dehydration and low blood pressure.    . metFORMIN (GLUCOPHAGE) 500 MG tablet Take by mouth 2 (two) times daily with a meal.    . metoprolol tartrate (LOPRESSOR) 25 MG tablet Hold until outpatient followup with your doctor due to dehydration and low blood pressure.    . ondansetron (ZOFRAN) 4 MG tablet Take 1 tablet (4 mg total) by mouth every 8 (eight) hours as needed for up to 10 doses for nausea or vomiting. 10 tablet 0  . pantoprazole (PROTONIX) 40 MG tablet Take 40 mg by mouth daily.    . Pregabalin (LYRICA PO) Take by mouth.    . SEREVENT DISKUS 50 MCG/DOSE diskus inhaler 1 puff every 12 (twelve) hours.    . SUMAtriptan (IMITREX) 100 MG tablet 1/2-1 TAB AT HEADACHE ONSET, CAN REPEAT ONCE IN 2 HOURS IF NEEDED NO MORE THAN 2 DOSES IN 24 HOURS.    Marland Kitchen topiramate (TOPAMAX) 50 MG tablet TAKE 1 TABLET BY MOUTH EVERY DAY    . traZODone (DESYREL) 100 MG tablet Take 100 mg by mouth at bedtime.    Marland Kitchen VRAYLAR capsule Take 3 mg by mouth daily.    . Ferrous Fumarate (IRON) 18 MG TBCR  Take 1 tablet (18 mg total) by mouth daily. Can take any over-the-counter iron supplement. (Patient not taking: Reported on 05/12/2020) 30 tablet 0   No current facility-administered medications for this visit.    Family History Family History  Problem Relation Age of Onset  . Diabetes Mother   . Hypertension Mother   . Hypertension Father       Social History Social History   Tobacco Use  . Smoking status: Current Some Day Smoker    Types: Cigars  . Smokeless tobacco: Former Network engineer  . Vaping Use: Never used  Substance Use Topics  . Alcohol use: No    Comment: rare  . Drug use: No        Review of Systems  Constitutional: Positive for weight loss.  HENT: Negative.   Eyes: Positive for  blurred vision.  Respiratory: Negative.   Cardiovascular: Negative.   Gastrointestinal: Positive for blood in stool.  Genitourinary: Positive for frequency.  Musculoskeletal: Positive for back pain.  Skin: Positive for itching.  Neurological: Positive for dizziness, tingling and headaches.  Psychiatric/Behavioral: Positive for depression.      Physical Exam Blood pressure 101/64, pulse 89, temperature 98.7 F (37.1 C), temperature source Oral, height 5\' 8"  (1.727 m), weight 173 lb (78.5 kg), SpO2 98 %. Last Weight  Most recent update: 05/12/2020  1:34 PM   Weight  78.5 kg (173 lb)            CONSTITUTIONAL: Well developed, and nourished, appropriately responsive and aware without distress.   EYES: Sclera non-icteric.   EARS, NOSE, MOUTH AND THROAT: Mask worn.    Hearing is intact to voice.  NECK: Trachea is midline, and there is no jugular venous distension.  LYMPH NODES:  Lymph nodes in the neck are not enlarged. RESPIRATORY:  Lungs are clear, and breath sounds are equal bilaterally. Normal respiratory effort without pathologic use of accessory muscles. CARDIOVASCULAR: Heart is regular in rate and rhythm. GI: The abdomen is  soft, nontender, and nondistended. There were no palpable masses. I did not appreciate hepatosplenomegaly. There were normal bowel sounds. GU: DRE: No remarkable external hemorrhoids.  Diffuse, circumferential grade 1 hemorrhoidal tissue, with minimal bogginess and diminishment of anal canal lumen.  No prominent piles, no redundancy.  No gross blood. MUSCULOSKELETAL:  Symmetrical muscle tone appreciated in all four extremities.    SKIN: Skin turgor is normal. No pathologic skin lesions appreciated.  NEUROLOGIC:  Motor and sensation appear grossly normal.  Cranial nerves are grossly without defect. PSYCH:  Alert and oriented to person, place and time. Affect is appropriate for situation.  Data Reviewed I have personally reviewed what is currently available of  the patient's imaging, recent labs and medical records.   Labs:  CBC Latest Ref Rng & Units 04/28/2020 04/27/2020 09/14/2019  WBC 4.0 - 10.5 K/uL 7.3 8.4 6.9  Hemoglobin 13.0 - 17.0 g/dL 8.0(L) 7.7(L) 8.5(L)  Hematocrit 39.0 - 52.0 % 28.3(L) 28.3(L) 30.5(L)  Platelets 150 - 400 K/uL 427(H) 596(H) 482(H)   CMP Latest Ref Rng & Units 04/28/2020 04/27/2020 09/14/2019  Glucose 70 - 99 mg/dL 307(H) 511(HH) 123(H)  BUN 6 - 20 mg/dL 13 25(H) 9  Creatinine 0.61 - 1.24 mg/dL 1.02 1.66(H) 1.04  Sodium 135 - 145 mmol/L 135 129(L) 141  Potassium 3.5 - 5.1 mmol/L 4.1 4.4 4.0  Chloride 98 - 111 mmol/L 107 98 104  CO2 22 - 32 mmol/L 22 24 25   Calcium 8.9 - 10.3 mg/dL 9.1  9.4 9.3  Total Protein 6.5 - 8.1 g/dL - - 7.8  Total Bilirubin 0.3 - 1.2 mg/dL - - 0.7  Alkaline Phos 38 - 126 U/L - - 106  AST 15 - 41 U/L - - 37  ALT 0 - 44 U/L - - 44      Imaging:  Within last 24 hrs: No results found.  Assessment    Anemia, potentially secondary to intermittent bleeding from hemorrhoidal tissue.  However concerns raised about possible more proximal site of bleeding, as patient indicates the blood per rectum is usually maroon and mixed with stool rather than bright red.   Patient Active Problem List   Diagnosis Date Noted  . Hyperglycemia due to type 2 diabetes mellitus ([place]) 04/27/2020  . Symptomatic anemia 04/27/2020  . AKI (acute kidney injury) ([place]) 04/27/2020  . Hyponatremia 04/27/2020  . COPD (chronic obstructive pulmonary disease) ([place]) 04/27/2020  . Anemia 04/27/2020  . Hypotension 04/27/2020  . History of GI bleed 04/27/2020  . Hypogonadism, male 07/29/2019  . Obstructive sleep apnea on CPAP 10/25/2016  . Sepsis ([place]) 07/23/2016  . Bloody diarrhea 07/23/2016  . Diabetes ([place]) 07/23/2016  . HTN (hypertension) 07/23/2016  . Anxiety 07/23/2016  . GERD (gastroesophageal reflux disease) 07/23/2016  . Chest pain 04/08/2016  . Blood in the stool 03/29/2016  . Bilateral carpal tunnel syndrome  03/21/2016  . Chronic bilateral low back pain without sciatica 01/12/2016  . Numbness and tingling 01/12/2016  . Anxiety, generalized 12/01/2015  . Difficulty sleeping 12/01/2015  . Migraine without aura and without status migrainosus, not intractable 12/01/2015    Plan At the present time I believe it would be a shot in a dark to attempt a prophylactic hemorrhoidectomy.  I am not convinced that his source of bleeding is the hemorrhoids but seems a likely source considering his relatively recent colonoscopy.  I know the initial plan from GI was to do some banding, and I do not think the referral to Dr. Marius Ditch ever happened.  Will give Dr. Marius Ditch the opportunity to band some of his internal hemorrhoids and perhaps consider repeat colonoscopy to ensure there is no other source.  I will be happy to see this patient again should I be of greater service.      Face-to-face time spent with the patient and accompanying care providers(if present) was 40 minutes, with more than 50% of the time spent counseling, educating, and coordinating care of the patient.      Ronny Bacon M.D., FACS 05/12/2020, 2:15 PM

## 2020-05-12 NOTE — Patient Instructions (Addendum)
I have put in a referral to Dr. Marius Ditch to perform the procedure of banding. They will call you for an appointment. If you have any concerns or questions, please feel free to call our office.  Dr. Sherri Sear [address], [place] [address]  Hemorrhoids Hemorrhoids are swollen veins in and around the rectum or anus. There are two types of hemorrhoids:  Internal hemorrhoids. These occur in the veins that are just inside the rectum. They may poke through to the outside and become irritated and painful.  External hemorrhoids. These occur in the veins that are outside the anus and can be felt as a painful swelling or hard lump near the anus. Most hemorrhoids do not cause serious problems, and they can be managed with home treatments such as diet and lifestyle changes. If home treatments do not help the symptoms, procedures can be done to shrink or remove the hemorrhoids. What are the causes? This condition is caused by increased pressure in the anal area. This pressure may result from various things, including:  Constipation.  Straining to have a bowel movement.  Diarrhea.  Pregnancy.  Obesity.  Sitting for long periods of time.  Heavy lifting or other activity that causes you to strain.  Anal sex.  Riding a bike for a long period of time. What are the signs or symptoms? Symptoms of this condition include:  Pain.  Anal itching or irritation.  Rectal bleeding.  Leakage of stool (feces).  Anal swelling.  One or more lumps around the anus. How is this diagnosed? This condition can often be diagnosed through a visual exam. Other exams or tests may also be done, such as:  An exam that involves feeling the rectal area with a gloved hand (digital rectal exam).  An exam of the anal canal that is done using a small tube (anoscope).  A blood test, if you have lost a significant amount of blood.  A test to look inside the colon using a flexible tube with a camera  on the end (sigmoidoscopy or colonoscopy). How is this treated? This condition can usually be treated at home. However, various procedures may be done if dietary changes, lifestyle changes, and other home treatments do not help your symptoms. These procedures can help make the hemorrhoids smaller or remove them completely. Some of these procedures involve surgery, and others do not. Common procedures include:  Rubber band ligation. Rubber bands are placed at the base of the hemorrhoids to cut off their blood supply.  Sclerotherapy. Medicine is injected into the hemorrhoids to shrink them.  Infrared coagulation. A type of light energy is used to get rid of the hemorrhoids.  Hemorrhoidectomy surgery. The hemorrhoids are surgically removed, and the veins that supply them are tied off.  Stapled hemorrhoidopexy surgery. The surgeon staples the base of the hemorrhoid to the rectal wall. Follow these instructions at home: Eating and drinking  Eat foods that have a lot of fiber in them, such as whole grains, beans, nuts, fruits, and vegetables.  Ask your health care provider about taking products that have added fiber (fiber supplements).  Reduce the amount of fat in your diet. You can do this by eating low-fat dairy products, eating less red meat, and avoiding processed foods.  Drink enough fluid to keep your urine pale yellow.   Managing pain and swelling  Take warm sitz baths for 20 minutes, 3-4 times a day to ease pain and discomfort. You may do this in a bathtub  or using a portable sitz bath that fits over the toilet.  If directed, apply ice to the affected area. Using ice packs between sitz baths may be helpful. ? Put ice in a plastic bag. ? Place a towel between your skin and the bag. ? Leave the ice on for 20 minutes, 2-3 times a day.   General instructions  Take over-the-counter and prescription medicines only as told by your health care provider.  Use medicated creams or  suppositories as told.  Get regular exercise. Ask your health care provider how much and what kind of exercise is best for you. In general, you should do moderate exercise for at least 30 minutes on most days of the week (150 minutes each week). This can include activities such as walking, biking, or yoga.  Go to the bathroom when you have the urge to have a bowel movement. Do not wait.  Avoid straining to have bowel movements.  Keep the anal area dry and clean. Use wet toilet paper or moist towelettes after a bowel movement.  Do not sit on the toilet for long periods of time. This increases blood pooling and pain.  Keep all follow-up visits as told by your health care provider. This is important. Contact a health care provider if you have:  Increasing pain and swelling that are not controlled by treatment or medicine.  Difficulty having a bowel movement, or you are unable to have a bowel movement.  Pain or inflammation outside the area of the hemorrhoids. Get help right away if you have:  Uncontrolled bleeding from your rectum. Summary  Hemorrhoids are swollen veins in and around the rectum or anus.  Most hemorrhoids can be managed with home treatments such as diet and lifestyle changes.  Taking warm sitz baths can help ease pain and discomfort.  In severe cases, procedures or surgery can be done to shrink or remove the hemorrhoids. This information is not intended to replace advice given to you by your health care provider. Make sure you discuss any questions you have with your health care provider. Document Revised: 06/22/2018 Document Reviewed: 06/15/2017 Elsevier Patient Education  [place].

## 2020-10-08 ENCOUNTER — Ambulatory Visit: Payer: Medicare Other | Admitting: Surgery

## 2020-10-08 ENCOUNTER — Encounter: Payer: Self-pay | Admitting: Surgery

## 2020-10-08 ENCOUNTER — Other Ambulatory Visit: Payer: Self-pay

## 2020-10-08 VITALS — BP 114/78 | HR 87 | Temp 98.0°F | Ht 68.0 in | Wt 185.0 lb

## 2020-10-08 DIAGNOSIS — Z1211 Encounter for screening for malignant neoplasm of colon: Secondary | ICD-10-CM

## 2020-10-08 DIAGNOSIS — K625 Hemorrhage of anus and rectum: Secondary | ICD-10-CM | POA: Diagnosis not present

## 2020-10-08 NOTE — Patient Instructions (Addendum)
A referral has been placed with GI for a colonoscopy. They will call you for an appointment.   If you have any concerns or questions, please feel free to call our office.   Hemorrhoids Hemorrhoids are swollen veins in and around the rectum or anus. There are two types of hemorrhoids: Internal hemorrhoids. These occur in the veins that are just inside the rectum. They may poke through to the outside and become irritated and painful. External hemorrhoids. These occur in the veins that are outside the anus and can be felt as a painful swelling or hard lump near the anus. Most hemorrhoids do not cause serious problems, and they can be managed with home treatments such as diet and lifestyle changes. If home treatments do not help the symptoms, procedures can be done to shrink or remove the hemorrhoids. What are the causes? This condition is caused by increased pressure in the anal area. This pressure may result from various things, including: Constipation. Straining to have a bowel movement. Diarrhea. Pregnancy. Obesity. Sitting for long periods of time. Heavy lifting or other activity that causes you to strain. Anal sex. Riding a bike for a long period of time. What are the signs or symptoms? Symptoms of this condition include: Pain. Anal itching or irritation. Rectal bleeding. Leakage of stool (feces). Anal swelling. One or more lumps around the anus. How is this diagnosed? This condition can often be diagnosed through a visual exam. Other exams or tests may also be done, such as: An exam that involves feeling the rectal area with a gloved hand (digital rectal exam). An exam of the anal canal that is done using a small tube (anoscope). A blood test, if you have lost a significant amount of blood. A test to look inside the colon using a flexible tube with a camera on the end (sigmoidoscopy or colonoscopy). How is this treated? This condition can usually be treated at home. However,  various procedures may be done if dietary changes, lifestyle changes, and other home treatments do not help your symptoms. These procedures can help make the hemorrhoids smaller or remove them completely. Some of these procedures involve surgery, and others do not. Common procedures include: Rubber band ligation. Rubber bands are placed at the base of the hemorrhoids to cut off their blood supply. Sclerotherapy. Medicine is injected into the hemorrhoids to shrink them. Infrared coagulation. A type of light energy is used to get rid of the hemorrhoids. Hemorrhoidectomy surgery. The hemorrhoids are surgically removed, and the veins that supply them are tied off. Stapled hemorrhoidopexy surgery. The surgeon staples the base of the hemorrhoid to the rectal wall. Follow these instructions at home: Eating and drinking  Eat foods that have a lot of fiber in them, such as whole grains, beans, nuts, fruits, and vegetables. Ask your health care provider about taking products that have added fiber (fiber supplements). Reduce the amount of fat in your diet. You can do this by eating low-fat dairy products, eating less red meat, and avoiding processed foods. Drink enough fluid to keep your urine pale yellow. Managing pain and swelling  Take warm sitz baths for 20 minutes, 3-4 times a day to ease pain and discomfort. You may do this in a bathtub or using a portable sitz bath that fits over the toilet. If directed, apply ice to the affected area. Using ice packs between sitz baths may be helpful. Put ice in a plastic bag. Place a towel between your skin and the bag. Leave  the ice on for 20 minutes, 2-3 times a day. General instructions Take over-the-counter and prescription medicines only as told by your health care provider. Use medicated creams or suppositories as told. Get regular exercise. Ask your health care provider how much and what kind of exercise is best for you. In general, you should do moderate  exercise for at least 30 minutes on most days of the week (150 minutes each week). This can include activities such as walking, biking, or yoga. Go to the bathroom when you have the urge to have a bowel movement. Do not wait. Avoid straining to have bowel movements. Keep the anal area dry and clean. Use wet toilet paper or moist towelettes after a bowel movement. Do not sit on the toilet for long periods of time. This increases blood pooling and pain. Keep all follow-up visits as told by your health care provider. This is important. Contact a health care provider if you have: Increasing pain and swelling that are not controlled by treatment or medicine. Difficulty having a bowel movement, or you are unable to have a bowel movement. Pain or inflammation outside the area of the hemorrhoids. Get help right away if you have: Uncontrolled bleeding from your rectum. Summary Hemorrhoids are swollen veins in and around the rectum or anus. Most hemorrhoids can be managed with home treatments such as diet and lifestyle changes. Taking warm sitz baths can help ease pain and discomfort. In severe cases, procedures or surgery can be done to shrink or remove the hemorrhoids. This information is not intended to replace advice given to you by your health care provider. Make sure you discuss any questions you have with your health care provider. Document Revised: 06/22/2018 Document Reviewed: 06/15/2017 Elsevier Patient Education  [place].

## 2020-10-08 NOTE — Progress Notes (Signed)
Patient ID: [name], male   DOB: May 20, 1970, 50 y.o.   MRN: [id number]  Chief Complaint: Bleeding.  History of Present Illness This gentleman returns today approximately 6 months since our last visit.  He throat reports he continues to have bleeding during and after bowel movements dripping into the bowl.  He reports it is always dark, never bright red blood and sometimes when he wipes he finds clots.  He denies any anal rectal pain or discomfort.  He reports fatigue, and for some reason has not been bleeding over the last 2 days.  Said some recent lab work by an out of network PCP.  He reports his bowel movements are about 3 times daily.  He admits there is likely been some miscommunication that prevented him from making the connection with his gastroenterological consultation. Previously: March 2022 [name] is a 50 y.o. male with 1-1/2 to 2-year history of internal hemorrhoids initially noted on colonoscopy.  He reports a history of thrombosed hemorrhoids, but more so having had dark red blood mixed with stool intermittent on occasion.  He denies any history of constipation.  Denies any history of diarrhea.  Denies any burning/itching or pain.  His last colonoscopy was August 2020 when these were noted.  There is also a hemoglobin at that time at 8.5.  He was recently in the ER for anemia and received a blood transfusion.  Has had no active hemorrhoidal bleeding since.  Past Medical History Past Medical History:  Diagnosis Date   Anxiety    Chronic back pain    Chronic neck pain    COPD (chronic obstructive pulmonary disease) (HCC)    Depression    Diabetes mellitus without complication ([place])    Enlarged heart    pt report   Enlarged kidney Left   GERD (gastroesophageal reflux disease)    Headache, migraine    Hypertension    Sleep apnea       Past Surgical History:  Procedure Laterality Date   CIRCUMCISION     COLONOSCOPY WITH PROPOFOL N/A 11/01/2016   Procedure: COLONOSCOPY  WITH PROPOFOL;  Surgeon: [name], MD;  Location: [facility] ENDOSCOPY;  Service: Gastroenterology;  Laterality: N/A;   COLONOSCOPY WITH PROPOFOL N/A 09/14/2018   Procedure: COLONOSCOPY WITH PROPOFOL;  Surgeon: [name], MD;  Location: [facility] ENDOSCOPY;  Service: Gastroenterology;  Laterality: N/A;    Allergies  Allergen Reactions   Hydrocodone Other (See Comments)    Constipation    Current Outpatient Medications  Medication Sig Dispense Refill   cariprazine (VRAYLAR) capsule Take 1.5 mg by mouth daily. At bedtime     Ferrous Fumarate (IRON) 18 MG TBCR Take 1 tablet (18 mg total) by mouth daily. Can take any over-the-counter iron supplement. (Patient not taking: Reported on 05/12/2020) 30 tablet 0   FLUoxetine (PROZAC) 40 MG capsule Take 40 mg by mouth daily.     gabapentin (NEURONTIN) 300 MG capsule Take 300 mg by mouth 3 (three) times daily.     LEVEMIR FLEXTOUCH 100 UNIT/ML FlexPen Inject 15 Units into the skin at bedtime.     lisinopril (ZESTRIL) 40 MG tablet Hold until outpatient followup with your doctor due to dehydration and low blood pressure.     metFORMIN (GLUCOPHAGE) 500 MG tablet Take by mouth 2 (two) times daily with a meal.     metoprolol tartrate (LOPRESSOR) 25 MG tablet Hold until outpatient followup with your doctor due to dehydration and low blood pressure.     ondansetron (ZOFRAN) 4  MG tablet Take 1 tablet (4 mg total) by mouth every 8 (eight) hours as needed for up to 10 doses for nausea or vomiting. 10 tablet 0   pantoprazole (PROTONIX) 40 MG tablet Take 40 mg by mouth daily.     Pregabalin (LYRICA PO) Take by mouth.     SEREVENT DISKUS 50 MCG/DOSE diskus inhaler 1 puff every 12 (twelve) hours.     SUMAtriptan (IMITREX) 100 MG tablet 1/2-1 TAB AT HEADACHE ONSET, CAN REPEAT ONCE IN 2 HOURS IF NEEDED NO MORE THAN 2 DOSES IN 24 HOURS.     topiramate (TOPAMAX) 50 MG tablet TAKE 1 TABLET BY MOUTH EVERY DAY     traZODone (DESYREL) 100 MG tablet Take 100 mg by mouth at bedtime.      VRAYLAR capsule Take 3 mg by mouth daily.     No current facility-administered medications for this visit.    Family History Family History  Problem Relation Age of Onset   Diabetes Mother    Hypertension Mother    Hypertension Father       Social History Social History   Tobacco Use   Smoking status: Some Days    Types: Cigars   Smokeless tobacco: Former  Scientific laboratory technician Use: Never used  Substance Use Topics   Alcohol use: No    Comment: rare   Drug use: No    Review of Systems  Constitutional:  Positive for malaise/fatigue.  HENT: Negative.    Eyes:  Positive for blurred vision.  Respiratory: Negative.    Cardiovascular: Negative.   Gastrointestinal:  Positive for blood in stool.  Genitourinary: Negative.   Musculoskeletal:  Positive for back pain.  Skin:  Positive for itching.  Neurological:  Positive for tingling.  Psychiatric/Behavioral:  Positive for depression.      Physical Exam Blood pressure 114/78, pulse 87, temperature 98 F (36.7 C), height '5\' 8"'$  (1.727 m), weight 185 lb (83.9 kg), SpO2 97 %. Last Weight  Most recent update: 10/08/2020 11:50 AM    Weight  83.9 kg (185 lb)             CONSTITUTIONAL: Well developed, and nourished, appropriately responsive and aware without distress.   EYES: Sclera non-icteric.   EARS, NOSE, MOUTH AND THROAT: Mask worn.    Hearing is intact to voice.  NECK: Trachea is midline, and there is no jugular venous distension.  LYMPH NODES:  Lymph nodes in the neck are not enlarged. RESPIRATORY:  Lungs are clear, and breath sounds are equal bilaterally. Normal respiratory effort without pathologic use of accessory muscles. CARDIOVASCULAR: Heart is regular in rate and rhythm. GI: The abdomen is  soft, nontender, and nondistended. There were no palpable masses. I did not appreciate hepatosplenomegaly. There were normal bowel sounds. GU: DRE: No remarkable external hemorrhoids.  What is notable today is on the  anterior right, about 5:00 at the anteriormost aspect of the anal verge, there is a mobile polyp/hemorrhoidal polyp present.  No prominent piles, no redundancy.  No gross blood. MUSCULOSKELETAL:  Symmetrical muscle tone appreciated in all four extremities.    SKIN: Skin turgor is normal. No pathologic skin lesions appreciated.  NEUROLOGIC:  Motor and sensation appear grossly normal.  Cranial nerves are grossly without defect. PSYCH:  Alert and oriented to person, place and time. Affect is appropriate for situation.  Data Reviewed I have personally reviewed what is currently available of the patient's imaging, recent labs and medical records.   Labs:  CBC  Latest Ref Rng & Units 04/28/2020 04/27/2020 09/14/2019  WBC 4.0 - 10.5 K/uL 7.3 8.4 6.9  Hemoglobin 13.0 - 17.0 g/dL 8.0(L) 7.7(L) 8.5(L)  Hematocrit 39.0 - 52.0 % 28.3(L) 28.3(L) 30.5(L)  Platelets 150 - 400 K/uL 427(H) 596(H) 482(H)   CMP Latest Ref Rng & Units 04/28/2020 04/27/2020 09/14/2019  Glucose 70 - 99 mg/dL 307(H) 511(HH) 123(H)  BUN 6 - 20 mg/dL 13 25(H) 9  Creatinine 0.61 - 1.24 mg/dL 1.02 1.66(H) 1.04  Sodium 135 - 145 mmol/L 135 129(L) 141  Potassium 3.5 - 5.1 mmol/L 4.1 4.4 4.0  Chloride 98 - 111 mmol/L 107 98 104  CO2 22 - 32 mmol/L '22 24 25  '$ Calcium 8.9 - 10.3 mg/dL 9.1 9.4 9.3  Total Protein 6.5 - 8.1 g/dL - - 7.8  Total Bilirubin 0.3 - 1.2 mg/dL - - 0.7  Alkaline Phos 38 - 126 U/L - - 106  AST 15 - 41 U/L - - 37  ALT 0 - 44 U/L - - 44      Imaging:  Within last 24 hrs: No results found.  Assessment    Anemia, potentially secondary to intermittent bleeding from internal hemorrhoidal tag/polyp.  However concerns raised about possible more proximal site of bleeding, as patient indicates the blood per rectum is usually maroon and mixed with stool and never bright red.   Patient Active Problem List   Diagnosis Date Noted   Hyperglycemia due to type 2 diabetes mellitus ([place]) 04/27/2020   Symptomatic anemia 04/27/2020    AKI (acute kidney injury) ([place]) 04/27/2020   Hyponatremia 04/27/2020   COPD (chronic obstructive pulmonary disease) ([place]) 04/27/2020   Anemia 04/27/2020   Hypotension 04/27/2020   History of GI bleed 04/27/2020   Hypogonadism, male 07/29/2019   Obstructive sleep apnea on CPAP 10/25/2016   Sepsis ([place]) 07/23/2016   Bloody diarrhea 07/23/2016   Diabetes ([place]) 07/23/2016   HTN (hypertension) 07/23/2016   Anxiety 07/23/2016   GERD (gastroesophageal reflux disease) 07/23/2016   Chest pain 04/08/2016   Blood in the stool 03/29/2016   Bilateral carpal tunnel syndrome 03/21/2016   Chronic bilateral low back pain without sciatica 01/12/2016   Numbness and tingling 01/12/2016   Anxiety, generalized 12/01/2015   Difficulty sleeping 12/01/2015   Migraine without aura and without status migrainosus, not intractable 12/01/2015    Plan I discussed the options of proceeding with rectal exam under anesthesia with excision/limited hemorrhoidectomy of the area of concern.  However I feel like we may be deferring a more likely etiology of his lower GI bleeding.  I have asked to expedite evaluation for colonoscopy.  We are glad to see him in the short interim thereafter to proceed with surgery in light of a negative colonoscopy.      Face-to-face time spent with the patient and accompanying care providers(if present) was 3 0 minutes, with more than 50% of the time spent counseling, educating, and coordinating care of the patient.      Ronny Bacon M.D., FACS 10/08/2020, 12:11 PM

## 2020-10-13 ENCOUNTER — Encounter: Payer: Self-pay | Admitting: *Deleted

## 2020-12-08 ENCOUNTER — Other Ambulatory Visit: Payer: Self-pay

## 2020-12-09 ENCOUNTER — Ambulatory Visit: Payer: Medicare Other | Admitting: Gastroenterology

## 2020-12-09 ENCOUNTER — Other Ambulatory Visit: Payer: Self-pay

## 2020-12-09 ENCOUNTER — Encounter: Payer: Self-pay | Admitting: Gastroenterology

## 2020-12-09 VITALS — BP 112/77 | HR 77 | Temp 98.3°F | Ht 68.0 in | Wt 189.0 lb

## 2020-12-09 DIAGNOSIS — K625 Hemorrhage of anus and rectum: Secondary | ICD-10-CM

## 2020-12-09 DIAGNOSIS — D508 Other iron deficiency anemias: Secondary | ICD-10-CM

## 2020-12-09 MED ORDER — NA SULFATE-K SULFATE-MG SULF 17.5-3.13-1.6 GM/177ML PO SOLN: 354.0000 mL | Freq: Once | ORAL | 0 refills | Status: AC

## 2020-12-09 NOTE — Progress Notes (Signed)
[name] MD, MRCP(U.K) [address]  [place]  [place], [place] [address]  Main: [phone]  Fax: [phone]   Gastroenterology Consultation  Referring Provider:     Ronny Bacon, MD Primary Care Physician:  Theotis Burrow, MD Primary Gastroenterologist:  Dr. [name]  Reason for Consultation:             HPI:   [name] is a 50 y.o. y/o male referred for rectal bleeding and colon cancer screening.  She was referred to see Dr. Christian Mate on 10/08/2020 for bleeding during bowel movements.  Colonoscopy on 09/14/2018 showed 3 sessile polyps 4 to 6 mm in size resected.  Diverticulosis of sigmoid colon.  One was a tubular adenoma and 2 of the polyps were hyperplastic.  Biopsies of some of the colonic mucosal fragment showed focal mild active colitis.  At that point of time plan was to perform banding of internal hemorrhoids.  Patient did not follow-up. He had told Dr. Christian Mate that the bleeding was usually dark and not bright red   04/28/2020 hemoglobin 8 g and MCV 74.94 years back hemoglobin 11.6 g.  B12 538 and ferritin of 2. In March 2022 was seen in the ER for symptomatic anemia.  He states that for almost a month he has been having on and off rectal bleeding characterized by clots and dark blood.  In the toilet bowl.  Denies any NSAID use.  Denies any hematemesis.  Denies any abdominal pain.  Denies any constipation.  Denies any perianal itching. Past Medical History:  Diagnosis Date   Anxiety    Chronic back pain    Chronic neck pain    COPD (chronic obstructive pulmonary disease) (HCC)    Depression    Diabetes mellitus without complication ([place])    Enlarged heart    pt report   Enlarged kidney Left   GERD (gastroesophageal reflux disease)    Headache, migraine    Hypertension    Sleep apnea     Past Surgical History:  Procedure Laterality Date   CIRCUMCISION     COLONOSCOPY WITH PROPOFOL N/A 11/01/2016   Procedure: COLONOSCOPY WITH  PROPOFOL;  Surgeon: [name], MD;  Location: [facility] ENDOSCOPY;  Service: Gastroenterology;  Laterality: N/A;   COLONOSCOPY WITH PROPOFOL N/A 09/14/2018   Procedure: COLONOSCOPY WITH PROPOFOL;  Surgeon: [name], MD;  Location: [facility] ENDOSCOPY;  Service: Gastroenterology;  Laterality: N/A;    Prior to Admission medications   Medication Sig Start Date End Date Taking? Authorizing Provider  Blood Glucose Monitoring Suppl (ACCU-CHEK GUIDE ME) w/Device KIT USE 1 AS DIRECTED AS DIRECTED THREE TIMES A DAY 07/03/20   [provider]  cariprazine (VRAYLAR) capsule Take 1.5 mg by mouth daily. At bedtime    [provider]  Ferrous Fumarate (IRON) 18 MG TBCR Take 1 tablet (18 mg total) by mouth daily. Can take any over-the-counter iron supplement. Patient not taking: Reported on 05/12/2020 04/28/20   Enzo Bi, MD  FLUoxetine (PROZAC) 40 MG capsule Take 40 mg by mouth daily. 03/27/20   [provider]  gabapentin (NEURONTIN) 300 MG capsule Take 300 mg by mouth 3 (three) times daily. 04/11/20   [provider]  LEVEMIR FLEXTOUCH 100 UNIT/ML FlexPen Inject 15 Units into the skin at bedtime. 04/21/20   [provider]  lisinopril (ZESTRIL) 40 MG tablet Hold until outpatient followup with your doctor due to dehydration and low blood pressure. 04/28/20   Enzo Bi, MD  metFORMIN (GLUCOPHAGE) 500  MG tablet Take by mouth 2 (two) times daily with a meal.    [provider]  metoprolol tartrate (LOPRESSOR) 25 MG tablet Hold until outpatient followup with your doctor due to dehydration and low blood pressure. 04/28/20   Enzo Bi, MD  ondansetron (ZOFRAN) 4 MG tablet Take 1 tablet (4 mg total) by mouth every 8 (eight) hours as needed for up to 10 doses for nausea or vomiting. 09/14/19   Lucrezia Starch, MD  pantoprazole (PROTONIX) 40 MG tablet Take 40 mg by mouth daily.    [provider]  Pregabalin (LYRICA PO) Take by mouth.    [provider]  SEREVENT  DISKUS 50 MCG/DOSE diskus inhaler 1 puff every 12 (twelve) hours. 02/03/20   [provider]  SUMAtriptan (IMITREX) 100 MG tablet 1/2-1 TAB AT HEADACHE ONSET, CAN REPEAT ONCE IN 2 HOURS IF NEEDED NO MORE THAN 2 DOSES IN 24 HOURS. 12/25/19   [provider]  topiramate (TOPAMAX) 50 MG tablet TAKE 1 TABLET BY MOUTH EVERY DAY 09/04/18   [provider]  traZODone (DESYREL) 100 MG tablet Take 100 mg by mouth at bedtime. 03/27/20   [provider]  VRAYLAR capsule Take 3 mg by mouth daily. 04/11/20   [provider]    Family History  Problem Relation Age of Onset   Diabetes Mother    Hypertension Mother    Hypertension Father      Social History   Tobacco Use   Smoking status: Some Days    Types: Cigars   Smokeless tobacco: Former  Scientific laboratory technician Use: Never used  Substance Use Topics   Alcohol use: No    Comment: rare   Drug use: No    Allergies as of 12/09/2020 - Review Complete 12/09/2020  Allergen Reaction Noted   Hydrocodone Other (See Comments) 06/13/2016    Review of Systems:    All systems reviewed and negative except where noted in HPI.   Physical Exam:  BP 112/77   Pulse 77   Temp 98.3 F (36.8 C) (Oral)   Ht '5\' 8"'  (1.727 m)   Wt 189 lb (85.7 kg)   BMI 28.74 kg/m  No LMP for male patient. Psych:  Alert and cooperative. Normal mood and affect. General:   Alert,  Well-developed, well-nourished, pleasant and cooperative in NAD Head:  Normocephalic and atraumatic. Eyes:  Sclera clear, no icterus.   Conjunctiva pink. Ears:  Normal auditory acuity. Nose:  No deformity, discharge, or lesions. Mouth:  No deformity or lesions,oropharynx pink & moist. Neck:  Supple; no masses or thyromegaly. Lungs:  Respirations even and unlabored.  Clear throughout to auscultation.   No wheezes, crackles, or rhonchi. No acute distress. Heart:  Regular rate and rhythm; no murmurs, clicks, rubs, or gallops. Abdomen:  Normal bowel sounds.   No bruits.  Soft, non-tender and non-distended without masses, hepatosplenomegaly or hernias noted.  No guarding or rebound tenderness.    Msk:  Symmetrical without gross deformities. Good, equal movement & strength bilaterally. Pulses:  Normal pulses noted. Extremities:  No clubbing or edema.  No cyanosis. Neurologic:  Alert and oriented x3;  grossly normal neurologically. Skin:  Intact without significant lesions or rashes. No jaundice. Lymph Nodes:  No significant cervical adenopathy. Psych:  Alert and cooperative. Normal mood and affect.  Imaging Studies: No results found.  Assessment and Plan:   [name] is a 50 y.o. y/o male has been referred for bright red blood per rectum.  Underwent a colonoscopy in 2020 which showed diverticulosis of colon and internal hemorrhoids.  There are some colitis seen on fragments of biopsies.  The patient did not follow-up subsequently.  Plan was for banding of his internal hemorrhoids.  It appears in the interim he has developed severe iron deficiency anemia.  Having dark blood per rectum.  Differentials does include upper GI source of bleeding versus lower GI.  With dark blood it could more likely be upper GI.  And in view of the iron deficiency anemia would need to rule out conditions such as AVMs of the colon stomach or small bowel.  Plan 1.  Recheck hemoglobin, iron studies.  If iron low and will need IV iron.  We will also check celiac serology, urine analysis and H. pylori status 2.  EGD and colonoscopy to be performed next week.  If negative will need capsule study of the small bowel.  If no source of bleeding seen we will plan to band his hemorrhoids in my office week after.   I have discussed alternative options, risks & benefits,  which include, but are not limited to, bleeding, infection, perforation,respiratory complication & drug reaction.  The patient agrees with this plan & written consent will be obtained.    Follow up in 2 weeks for  hemorrhoidal banding  Dr [name] MD,MRCP(U.K)

## 2020-12-10 LAB — H. PYLORI BREATH TEST: H pylori Breath Test: NEGATIVE

## 2020-12-10 LAB — CBC WITH DIFFERENTIAL/PLATELET
Basophils Absolute: 0 10*3/uL (ref 0.0–0.2)
Basos: 1 %
EOS (ABSOLUTE): 0.1 10*3/uL (ref 0.0–0.4)
Eos: 2 %
Hematocrit: 41 % (ref 37.5–51.0)
Hemoglobin: 12.4 g/dL — ABNORMAL LOW (ref 13.0–17.7)
Immature Grans (Abs): 0 10*3/uL (ref 0.0–0.1)
Immature Granulocytes: 1 %
Lymphocytes Absolute: 2.8 10*3/uL (ref 0.7–3.1)
Lymphs: 41 %
MCH: 24.3 pg — ABNORMAL LOW (ref 26.6–33.0)
MCHC: 30.2 g/dL — ABNORMAL LOW (ref 31.5–35.7)
MCV: 80 fL (ref 79–97)
Monocytes Absolute: 0.4 10*3/uL (ref 0.1–0.9)
Monocytes: 6 %
Neutrophils Absolute: 3.3 10*3/uL (ref 1.4–7.0)
Neutrophils: 49 %
Platelets: 405 10*3/uL (ref 150–450)
RBC: 5.1 x10E6/uL (ref 4.14–5.80)
RDW: 16.7 % — ABNORMAL HIGH (ref 11.6–15.4)
WBC: 6.7 10*3/uL (ref 3.4–10.8)

## 2020-12-14 LAB — URINALYSIS
Bilirubin, UA: NEGATIVE
Ketones, UA: NEGATIVE
Leukocytes,UA: NEGATIVE
Nitrite, UA: NEGATIVE
Protein,UA: NEGATIVE
RBC, UA: NEGATIVE
Specific Gravity, UA: 1.022 (ref 1.005–1.030)
Urobilinogen, Ur: 0.2 mg/dL (ref 0.2–1.0)
pH, UA: 5.5 (ref 5.0–7.5)

## 2020-12-14 LAB — IRON,TIBC AND FERRITIN PANEL
Ferritin: 37 ng/mL (ref 30–400)
Iron Saturation: 9 % — CL (ref 15–55)
Iron: 36 ug/dL — ABNORMAL LOW (ref 38–169)
Total Iron Binding Capacity: 407 ug/dL (ref 250–450)
UIBC: 371 ug/dL — ABNORMAL HIGH (ref 111–343)

## 2020-12-14 LAB — CELIAC DISEASE AB SCREEN W/RFX
Antigliadin Abs, IgA: 8 units (ref 0–19)
IgA/Immunoglobulin A, Serum: 598 mg/dL — ABNORMAL HIGH (ref 90–386)
Transglutaminase IgA: <2 U/mL (ref 0–3)

## 2020-12-17 ENCOUNTER — Ambulatory Visit: Payer: Medicare Other | Admitting: Registered Nurse

## 2020-12-17 ENCOUNTER — Encounter: Payer: Self-pay | Admitting: Gastroenterology

## 2020-12-17 ENCOUNTER — Other Ambulatory Visit: Payer: Self-pay

## 2020-12-17 ENCOUNTER — Ambulatory Visit
Admission: RE | Admit: 2020-12-17 | Discharge: 2020-12-17 | Disposition: A | Discharge status: Home / Self Care | Payer: Medicare Other | Attending: Gastroenterology | Admitting: Gastroenterology

## 2020-12-17 ENCOUNTER — Encounter
Admission: RE | Disposition: A | Discharge status: Home / Self Care | Payer: Self-pay | Source: Home / Self Care | Attending: Gastroenterology

## 2020-12-17 DIAGNOSIS — G709 Myoneural disorder, unspecified: Secondary | ICD-10-CM | POA: Insufficient documentation

## 2020-12-17 DIAGNOSIS — F17290 Nicotine dependence, other tobacco product, uncomplicated: Secondary | ICD-10-CM | POA: Insufficient documentation

## 2020-12-17 DIAGNOSIS — K5730 Diverticulosis of large intestine without perforation or abscess without bleeding: Secondary | ICD-10-CM | POA: Insufficient documentation

## 2020-12-17 DIAGNOSIS — K621 Rectal polyp: Secondary | ICD-10-CM | POA: Insufficient documentation

## 2020-12-17 DIAGNOSIS — K640 First degree hemorrhoids: Secondary | ICD-10-CM | POA: Insufficient documentation

## 2020-12-17 DIAGNOSIS — D509 Iron deficiency anemia, unspecified: Secondary | ICD-10-CM | POA: Insufficient documentation

## 2020-12-17 DIAGNOSIS — K219 Gastro-esophageal reflux disease without esophagitis: Secondary | ICD-10-CM | POA: Diagnosis not present

## 2020-12-17 DIAGNOSIS — D126 Benign neoplasm of colon, unspecified: Secondary | ICD-10-CM | POA: Diagnosis not present

## 2020-12-17 DIAGNOSIS — J449 Chronic obstructive pulmonary disease, unspecified: Secondary | ICD-10-CM | POA: Diagnosis not present

## 2020-12-17 DIAGNOSIS — K625 Hemorrhage of anus and rectum: Secondary | ICD-10-CM | POA: Diagnosis not present

## 2020-12-17 DIAGNOSIS — G4730 Sleep apnea, unspecified: Secondary | ICD-10-CM | POA: Diagnosis not present

## 2020-12-17 DIAGNOSIS — E119 Type 2 diabetes mellitus without complications: Secondary | ICD-10-CM | POA: Diagnosis not present

## 2020-12-17 DIAGNOSIS — R519 Headache, unspecified: Secondary | ICD-10-CM | POA: Diagnosis not present

## 2020-12-17 DIAGNOSIS — K635 Polyp of colon: Secondary | ICD-10-CM | POA: Insufficient documentation

## 2020-12-17 DIAGNOSIS — D508 Other iron deficiency anemias: Secondary | ICD-10-CM

## 2020-12-17 HISTORY — PX: ESOPHAGOGASTRODUODENOSCOPY: SHX5428

## 2020-12-17 HISTORY — PX: COLONOSCOPY WITH PROPOFOL: SHX5780

## 2020-12-17 LAB — GLUCOSE, CAPILLARY: Glucose-Capillary: 125 mg/dL — ABNORMAL HIGH (ref 70–99)

## 2020-12-17 SURGERY — COLONOSCOPY WITH PROPOFOL
Anesthesia: General

## 2020-12-17 MED ORDER — LIDOCAINE HCL (CARDIAC) PF 100 MG/5ML IV SOSY
PREFILLED_SYRINGE | INTRAVENOUS | Status: DC | PRN
  Administered 2020-12-17: 100 mg via INTRAVENOUS

## 2020-12-17 MED ORDER — PROPOFOL 500 MG/50ML IV EMUL
INTRAVENOUS | Status: DC | PRN
  Administered 2020-12-17: 140 ug/kg/min via INTRAVENOUS

## 2020-12-17 MED ORDER — SODIUM CHLORIDE 0.9 % IV SOLN: INTRAVENOUS | Status: DC

## 2020-12-17 MED ORDER — PROPOFOL 10 MG/ML IV BOLUS
INTRAVENOUS | Status: DC | PRN
  Administered 2020-12-17: 70 mg via INTRAVENOUS

## 2020-12-17 NOTE — Op Note (Signed)
[facility] Gastroenterology Patient Name: [name] Procedure Date: 12/17/2020 7:13 AM MRN: [id number] Account #: [id number] Date of Birth: 1970-05-23 Admit Type: Outpatient Age: 50 Room: [facility] ENDO ROOM 3 Gender: Male Note Status: Finalized Instrument Name: Park Meo [id number] Procedure:             Colonoscopy Indications:           Iron deficiency anemia Providers:             [name] MD, MD Referring MD:          Elyse Jarvis Revelo (Referring MD) Medicines:             Monitored Anesthesia Care Complications:         No immediate complications. Procedure:             Pre-Anesthesia Assessment:                        - Prior to the procedure, a History and Physical was                         performed, and patient medications, allergies and                         sensitivities were reviewed. The patient's tolerance                         of previous anesthesia was reviewed.                        - The risks and benefits of the procedure and the                         sedation options and risks were discussed with the                         patient. All questions were answered and informed                         consent was obtained.                        - ASA Grade Assessment: II - A patient with mild                         systemic disease.                        After obtaining informed consent, the colonoscope was                         passed under direct vision. Throughout the procedure,                         the patient's blood pressure, pulse, and oxygen                         saturations were monitored continuously. The                         Colonoscope was introduced through the  anus and                         advanced to the the cecum, identified by the                         appendiceal orifice. The colonoscopy was performed                         with ease. The patient tolerated the procedure well.                          The quality of the bowel preparation was good. Findings:      A 3 mm polyp was found in the cecum. The polyp was sessile. The polyp       was removed with a cold biopsy forceps. Resection and retrieval were       complete.      A 5 mm polyp was found in the rectum. The polyp was sessile. The polyp       was removed with a cold snare. Resection and retrieval were complete.      Multiple small-mouthed diverticula were found in the sigmoid colon.      Non-bleeding internal hemorrhoids were found during retroflexion. The       hemorrhoids were large and Grade I (internal hemorrhoids that do not       prolapse).      The exam was otherwise without abnormality on direct and retroflexion       views. Impression:            - One 3 mm polyp in the cecum, removed with a cold                         biopsy forceps. Resected and retrieved.                        - One 5 mm polyp in the rectum, removed with a cold                         snare. Resected and retrieved.                        - Diverticulosis in the sigmoid colon.                        - Non-bleeding internal hemorrhoids.                        - The examination was otherwise normal on direct and                         retroflexion views. Recommendation:        - Discharge patient to home.                        - Resume previous diet.                        - Continue present medications.                        -  Await pathology results.                        - Repeat colonoscopy for surveillance based on                         pathology results.                        - To visualize the small bowel, perform video capsule                         endoscopy in 2 weeks.                        - Refer to surgeon for hemorrhoid banding in 2 weeks. Procedure Code(s):     --- Professional ---                        [phone], Colonoscopy, flexible; with removal of                         tumor(s), polyp(s), or other lesion(s) by snare                          technique                        [id number], 89, Colonoscopy, flexible; with biopsy, single                         or multiple Diagnosis Code(s):     --- Professional ---                        K63.5, Polyp of colon                        K62.1, Rectal polyp                        K64.0, First degree hemorrhoids                        D50.9, Iron deficiency anemia, unspecified                        K57.30, Diverticulosis of large intestine without                         perforation or abscess without bleeding CPT copyright 2019 [employer]. All rights reserved. The codes documented in this report are preliminary and upon coder review may  be revised to meet current compliance requirements. [name], MD [name] MD, MD 12/17/2020 8:09:20 AM This report has been signed electronically. Number of Addenda: 0 Note Initiated On: 12/17/2020 7:13 AM Scope Withdrawal Time: 0 hours 10 minutes 34 seconds  Total Procedure Duration: 0 hours 13 minutes 43 seconds  Estimated Blood Loss:  Estimated blood loss: none.      [facility]

## 2020-12-17 NOTE — Op Note (Signed)
[facility] Gastroenterology Patient Name: [name] Procedure Date: 12/17/2020 7:14 AM MRN: [id number] Account #: [id number] Date of Birth: 1970/05/06 Admit Type: Outpatient Age: 50 Room: [facility] ENDO ROOM 3 Gender: Male Note Status: Finalized Instrument Name: Upper Endoscope [id number] Procedure:             Upper GI endoscopy Indications:           Iron deficiency anemia Providers:             [name] MD, MD Referring MD:          Elyse Jarvis Revelo (Referring MD) Medicines:             Monitored Anesthesia Care Complications:         No immediate complications. Procedure:             Pre-Anesthesia Assessment:                        - Prior to the procedure, a History and Physical was                         performed, and patient medications, allergies and                         sensitivities were reviewed. The patient's tolerance                         of previous anesthesia was reviewed.                        - The risks and benefits of the procedure and the                         sedation options and risks were discussed with the                         patient. All questions were answered and informed                         consent was obtained.                        - ASA Grade Assessment: II - A patient with mild                         systemic disease.                        After obtaining informed consent, the endoscope was                         passed under direct vision. Throughout the procedure,                         the patient's blood pressure, pulse, and oxygen                         saturations were monitored continuously. The Endoscope                         was  introduced through the mouth, and advanced to the                         third part of duodenum. The upper GI endoscopy was                         accomplished with ease. The patient tolerated the                         procedure well. Findings:      The  esophagus was normal.      A large amount of food (residue) was found in the entire examined       stomach.      The examined duodenum was normal. Impression:            - Normal esophagus.                        - A large amount of food (residue) in the stomach.                        - Normal examined duodenum.                        - No specimens collected. Recommendation:        - Repeat upper endoscopy in 2 weeks because the                         preparation was poor.                        - 24 hours of clears before next procedure                        - Perform a colonoscopy today. Procedure Code(s):     --- Professional ---                        [id number], Esophagogastroduodenoscopy, flexible,                         transoral; diagnostic, including collection of                         specimen(s) by brushing or washing, when performed                         (separate procedure) Diagnosis Code(s):     --- Professional ---                        D50.9, Iron deficiency anemia, unspecified CPT copyright 2019 American Medical Association. All rights reserved. The codes documented in this report are preliminary and upon coder review may  be revised to meet current compliance requirements. [name], MD [name] MD, MD 12/17/2020 7:51:13 AM This report has been signed electronically. Number of Addenda: 0 Note Initiated On: 12/17/2020 7:14 AM Estimated Blood Loss:  Estimated blood loss: none.      [facility]

## 2020-12-17 NOTE — Anesthesia Postprocedure Evaluation (Signed)
Anesthesia Post Note  Patient: [name]  Procedure(s) Performed: COLONOSCOPY WITH PROPOFOL ESOPHAGOGASTRODUODENOSCOPY (EGD)  Patient location during evaluation: Endoscopy Anesthesia Type: General Level of consciousness: awake and alert Pain management: pain level controlled Vital Signs Assessment: post-procedure vital signs reviewed and stable Respiratory status: spontaneous breathing, nonlabored ventilation, respiratory function stable and patient connected to nasal cannula oxygen Cardiovascular status: blood pressure returned to baseline and stable Postop Assessment: no apparent nausea or vomiting Anesthetic complications: no   No notable events documented.   Last Vitals:  Vitals:   12/17/20 0829 12/17/20 0839  BP: 119/78   Pulse: 73 75  Resp: 16 16  Temp:    SpO2: 100% 100%    Last Pain:  Vitals:   12/17/20 0839  TempSrc:   PainSc: 0-No pain                 Precious Haws Jorge Amparo

## 2020-12-17 NOTE — Anesthesia Preprocedure Evaluation (Signed)
Anesthesia Evaluation  Patient identified by MRN, date of birth, ID band Patient awake    Reviewed: Allergy & Precautions, NPO status , Patient's Chart, lab work & pertinent test results  History of Anesthesia Complications Negative for: history of anesthetic complications  Airway Mallampati: III  TM Distance: >3 FB Neck ROM: full    Dental  (+) Chipped, Poor Dentition, Missing   Pulmonary sleep apnea , COPD, Current Smoker,    Pulmonary exam normal        Cardiovascular Exercise Tolerance: Good hypertension, (-) angina(-) Past MI and (-) DOE Normal cardiovascular exam     Neuro/Psych  Headaches, PSYCHIATRIC DISORDERS  Neuromuscular disease    GI/Hepatic Neg liver ROS, GERD  ,  Endo/Other  diabetes, Type 2  Renal/GU Renal disease  negative genitourinary   Musculoskeletal   Abdominal   Peds  Hematology negative hematology ROS (+)   Anesthesia Other Findings Past Medical History: No date: Anxiety No date: Chronic back pain No date: Chronic neck pain No date: COPD (chronic obstructive pulmonary disease) (HCC) No date: Depression No date: Diabetes mellitus without complication (HCC) No date: Enlarged heart     Comment:  pt report Left: Enlarged kidney No date: GERD (gastroesophageal reflux disease) No date: Headache, migraine No date: Hypertension No date: Sleep apnea  Past Surgical History: No date: CIRCUMCISION 11/01/2016: COLONOSCOPY WITH PROPOFOL; N/A     Comment:  Procedure: COLONOSCOPY WITH PROPOFOL;  Surgeon: [name], MD;  Location: [facility] ENDOSCOPY;  Service:               Gastroenterology;  Laterality: N/A; 09/14/2018: COLONOSCOPY WITH PROPOFOL; N/A     Comment:  Procedure: COLONOSCOPY WITH PROPOFOL;  Surgeon: [name], MD;  Location: [facility] ENDOSCOPY;  Service:               Gastroenterology;  Laterality: N/A;  BMI    Body Mass Index: 27.98 kg/m       Reproductive/Obstetrics negative OB ROS                             Anesthesia Physical Anesthesia Plan  ASA: 3  Anesthesia Plan: General   Post-op Pain Management:    Induction: Intravenous  PONV Risk Score and Plan: Propofol infusion and TIVA  Airway Management Planned: Natural Airway and Nasal Cannula  Additional Equipment:   Intra-op Plan:   Post-operative Plan:   Informed Consent: I have reviewed the patients History and Physical, chart, labs and discussed the procedure including the risks, benefits and alternatives for the proposed anesthesia with the patient or authorized representative who has indicated his/her understanding and acceptance.     Dental Advisory Given  Plan Discussed with: Anesthesiologist, CRNA and Surgeon  Anesthesia Plan Comments: (Patient consented for risks of anesthesia including but not limited to:  - adverse reactions to medications - risk of airway placement if required - damage to eyes, teeth, lips or other oral mucosa - nerve damage due to positioning  - sore throat or hoarseness - Damage to heart, brain, nerves, lungs, other parts of body or loss of life  Patient voiced understanding.)        Anesthesia Quick Evaluation

## 2020-12-17 NOTE — Transfer of Care (Signed)
Immediate Anesthesia Transfer of Care Note  Patient: [name]  Procedure(s) Performed: COLONOSCOPY WITH PROPOFOL ESOPHAGOGASTRODUODENOSCOPY (EGD)  Patient Location: PACU  Anesthesia Type:General  Level of Consciousness: awake, alert  and oriented  Airway & Oxygen Therapy: Patient Spontanous Breathing and Patient connected to face mask oxygen  Post-op Assessment: Report given to RN and Post -op Vital signs reviewed and stable  Post vital signs: Reviewed and stable  Last Vitals:  Vitals Value Taken Time  BP 110/75 12/17/20 0809  Temp    Pulse 77 12/17/20 0813  Resp 20 12/17/20 0813  SpO2 98 % 12/17/20 0813  Vitals shown include unvalidated device data.  Last Pain:  Vitals:   12/17/20 0809  TempSrc:   PainSc: 0-No pain         Complications: No notable events documented.

## 2020-12-17 NOTE — H&P (Signed)
[name], MD [address], [place], [place], [place], [address] [address], [place], [place], [place], [address] Phone: [phone]  Fax: [phone]  Primary Care Physician:  Theotis Burrow, MD   Pre-Procedure History & Physical: HPI:  [name] is a 50 y.o. male is here for an endoscopy and colonoscopy    Past Medical History:  Diagnosis Date   Anxiety    Chronic back pain    Chronic neck pain    COPD (chronic obstructive pulmonary disease) ([place])    Depression    Diabetes mellitus without complication ([place])    Enlarged heart    pt report   Enlarged kidney Left   GERD (gastroesophageal reflux disease)    Headache, migraine    Hypertension    Sleep apnea     Past Surgical History:  Procedure Laterality Date   CIRCUMCISION     COLONOSCOPY WITH PROPOFOL N/A 11/01/2016   Procedure: COLONOSCOPY WITH PROPOFOL;  Surgeon: [name], MD;  Location: [facility] ENDOSCOPY;  Service: Gastroenterology;  Laterality: N/A;   COLONOSCOPY WITH PROPOFOL N/A 09/14/2018   Procedure: COLONOSCOPY WITH PROPOFOL;  Surgeon: [name], MD;  Location: [facility] ENDOSCOPY;  Service: Gastroenterology;  Laterality: N/A;    Prior to Admission medications   Medication Sig Start Date End Date Taking? Authorizing Provider  cariprazine (VRAYLAR) capsule Take 1.5 mg by mouth daily. At bedtime   Yes [provider]  ferrous sulfate 325 (65 FE) MG tablet Take 325 mg by mouth 2 (two) times daily. 11/24/20  Yes [provider]  FLUoxetine (PROZAC) 40 MG capsule Take 40 mg by mouth daily. 03/27/20  Yes [provider]  gabapentin (NEURONTIN) 300 MG capsule Take 300 mg by mouth 3 (three) times daily. 04/11/20  Yes [provider]  JARDIANCE 10 MG TABS tablet Take 10 mg by mouth daily. 11/09/20  Yes [provider]  LANTUS SOLOSTAR 100 UNIT/ML Solostar Pen SMARTSIG:15 Unit(s) SUB-Q Every Night 09/20/20  Yes [provider]  lisinopril (ZESTRIL) 40  MG tablet Hold until outpatient followup with your doctor due to dehydration and low blood pressure. 04/28/20  Yes Enzo Bi, MD  metFORMIN (GLUCOPHAGE) 500 MG tablet Take by mouth 2 (two) times daily with a meal.   Yes [provider]  metoprolol tartrate (LOPRESSOR) 25 MG tablet Hold until outpatient followup with your doctor due to dehydration and low blood pressure. 04/28/20  Yes Enzo Bi, MD  ondansetron (ZOFRAN) 4 MG tablet Take 1 tablet (4 mg total) by mouth every 8 (eight) hours as needed for up to 10 doses for nausea or vomiting. 09/14/19  Yes Lucrezia Starch, MD  pantoprazole (PROTONIX) 40 MG tablet Take 40 mg by mouth daily.   Yes [provider]  Pregabalin (LYRICA PO) Take by mouth.   Yes [provider]  SEREVENT DISKUS 50 MCG/DOSE diskus inhaler 1 puff every 12 (twelve) hours. 02/03/20  Yes [provider]  sildenafil (VIAGRA) 50 MG tablet Take 50 mg by mouth daily as needed. 08/05/20  Yes [provider]  SUMAtriptan (IMITREX) 100 MG tablet 1/2-1 TAB AT HEADACHE ONSET, CAN REPEAT ONCE IN 2 HOURS IF NEEDED NO MORE THAN 2 DOSES IN 24 HOURS. 12/25/19  Yes [provider]  topiramate (TOPAMAX) 50 MG tablet TAKE 1 TABLET BY MOUTH EVERY DAY 09/04/18  Yes [provider]  traZODone (DESYREL) 100 MG tablet Take 100 mg by mouth at bedtime. 03/27/20  Yes [provider]  Arman Filter  capsule Take 3 mg by mouth daily. 04/11/20  Yes [provider]  ACCU-CHEK GUIDE test strip USE 1 STRIP AS DIRECTED THREE TIMES A DAY 10/25/20   [provider]  B-D UF III MINI PEN NEEDLES 31G X 5 MM MISC Inject into the skin. 11/10/20   [provider]  Blood Glucose Monitoring Suppl (ACCU-CHEK GUIDE ME) w/Device KIT USE 1 AS DIRECTED AS DIRECTED THREE TIMES A DAY 07/03/20   [provider]  LEVEMIR FLEXTOUCH 100 UNIT/ML FlexPen Inject 15 Units into the skin at bedtime. Patient not taking: Reported on 12/17/2020 04/21/20    [provider]    Allergies as of 12/09/2020 - Review Complete 12/09/2020  Allergen Reaction Noted   Hydrocodone Other (See Comments) 06/13/2016    Family History  Problem Relation Age of Onset   Diabetes Mother    Hypertension Mother    Hypertension Father     Social History   Socioeconomic History   Marital status: Married    Spouse name: Not on file   Number of children: Not on file   Years of education: Not on file   Highest education level: Not on file  Occupational History   Not on file  Tobacco Use   Smoking status: Some Days    Types: Cigars   Smokeless tobacco: Former  Scientific laboratory technician Use: Never used  Substance and Sexual Activity   Alcohol use: No    Comment: rare   Drug use: No   Sexual activity: Not on file  Other Topics Concern   Not on file  Social History Narrative   Not on file   Social Determinants of Health   Financial Resource Strain: Not on file  Food Insecurity: Not on file  Transportation Needs: Not on file  Physical Activity: Not on file  Stress: Not on file  Social Connections: Not on file  Intimate Partner Violence: Not on file    Review of Systems: See HPI, otherwise negative ROS  Physical Exam: BP 111/80   Pulse 82   Temp (!) 96.9 F (36.1 C) (Temporal)   Resp 18   Ht '5\' 8"'  (1.727 m)   Wt 83.5 kg   SpO2 98%   BMI 27.98 kg/m  General:   Alert,  pleasant and cooperative in NAD Head:  Normocephalic and atraumatic. Neck:  Supple; no masses or thyromegaly. Lungs:  Clear throughout to auscultation, normal respiratory effort.    Heart:  +S1, +S2, Regular rate and rhythm, No edema. Abdomen:  Soft, nontender and nondistended. Normal bowel sounds, without guarding, and without rebound.   Neurologic:  Alert and  oriented x4;  grossly normal neurologically.  Impression/Plan: [name] is here for an endoscopy and colonoscopy  to be performed for  evaluation of iron deficiency anemia    Risks, benefits,  limitations, and alternatives regarding endoscopy have been reviewed with the patient.  Questions have been answered.  All parties agreeable.   [name], MD  12/17/2020, 7:43 AM

## 2020-12-18 ENCOUNTER — Encounter: Payer: Self-pay | Admitting: Gastroenterology

## 2020-12-18 LAB — SURGICAL PATHOLOGY

## 2020-12-29 ENCOUNTER — Ambulatory Visit: Payer: Medicare Other | Admitting: Gastroenterology

## 2021-01-28 ENCOUNTER — Ambulatory Visit: Payer: Medicare Other | Admitting: Gastroenterology

## 2021-03-18 ENCOUNTER — Other Ambulatory Visit: Payer: Self-pay

## 2021-03-18 ENCOUNTER — Encounter: Payer: Self-pay | Admitting: Gastroenterology

## 2021-03-18 ENCOUNTER — Ambulatory Visit (INDEPENDENT_AMBULATORY_CARE_PROVIDER_SITE_OTHER): Payer: Medicare HMO | Admitting: Gastroenterology

## 2021-03-18 VITALS — BP 131/80 | HR 87 | Temp 99.0°F | Wt 200.0 lb

## 2021-03-18 DIAGNOSIS — D508 Other iron deficiency anemias: Secondary | ICD-10-CM

## 2021-03-18 DIAGNOSIS — K625 Hemorrhage of anus and rectum: Secondary | ICD-10-CM

## 2021-03-18 DIAGNOSIS — K648 Other hemorrhoids: Secondary | ICD-10-CM | POA: Diagnosis not present

## 2021-03-18 NOTE — Progress Notes (Signed)
[name] MD, MRCP(U.K) [address]  [place]  [place], [place] [address]  Main: [phone]  Fax: [phone]   Primary Care Physician: Theotis Burrow, MD  Primary Gastroenterologist:  Dr. [name]   Chief Complaint  Patient presents with   Banding #1    HPI: Maverik Foot is a 51 y.o. male    Summary of history :  Was seen on 12/09/2020 for rectal bleeding on and off for months with blood on the toilet bowl.  He was referred to see Dr. Christian Mate on 10/08/2020 for bleeding during bowel movements.  Colonoscopy on 09/14/2018 showed 3 sessile polyps 4 to 6 mm in size resected.  Diverticulosis of sigmoid colon.  One was a tubular adenoma and 2 of the polyps were hyperplastic.  Biopsies of some of the colonic mucosal fragment showed focal mild active colitis.  At that point of time plan was to perform banding of internal hemorrhoids.  Patient did not follow-up.     04/28/2020 hemoglobin 8 g and MCV 74.94 years back hemoglobin 11.6 g.  B12 538 and ferritin of 2. In March 2022 was seen in the ER for symptomatic anemia.   Interval history   12/09/2020-03/18/2021  12/09/2020 ferritin 37-36, urinalysis showed no blood, celiac serology was negative and H. pylori breath test was negative.  Hemoglobin had improved to 12.4 g from 8 g 12/17/2020: EGD: Large amount of food was seen in the stomach and repeat procedure suggested Colonoscopy was performed in same-day 2 polyps 3 to 5 mm in size were resected and nonbleeding large internal hemorrhoids were noted.  Plan was to repeat EGD, capsule study of small bowel in 2 weeks and banding of hemorrhoids  Still having painless rectal bleeding associated with bowel movements with blood on the tissue paper Current Outpatient Medications  Medication Sig Dispense Refill   ACCU-CHEK GUIDE test strip USE 1 STRIP AS DIRECTED THREE TIMES A DAY     B-D UF III MINI PEN NEEDLES 31G X 5 MM MISC Inject into the skin.     Blood Glucose Monitoring  Suppl (ACCU-CHEK GUIDE ME) w/Device KIT USE 1 AS DIRECTED AS DIRECTED THREE TIMES A DAY     cariprazine (VRAYLAR) capsule Take 1.5 mg by mouth daily. At bedtime     ferrous sulfate 325 (65 FE) MG tablet Take 325 mg by mouth 2 (two) times daily.     FLUoxetine (PROZAC) 40 MG capsule Take 40 mg by mouth daily.     gabapentin (NEURONTIN) 300 MG capsule Take 300 mg by mouth 3 (three) times daily.     JARDIANCE 10 MG TABS tablet Take 10 mg by mouth daily.     LANTUS SOLOSTAR 100 UNIT/ML Solostar Pen SMARTSIG:15 Unit(s) SUB-Q Every Night     LEVEMIR FLEXTOUCH 100 UNIT/ML FlexPen Inject 15 Units into the skin at bedtime.     lisinopril (ZESTRIL) 40 MG tablet Hold until outpatient followup with your doctor due to dehydration and low blood pressure.     metFORMIN (GLUCOPHAGE) 500 MG tablet Take by mouth 2 (two) times daily with a meal.     metoprolol tartrate (LOPRESSOR) 25 MG tablet Hold until outpatient followup with your doctor due to dehydration and low blood pressure.     ondansetron (ZOFRAN) 4 MG tablet Take 1 tablet (4 mg total) by mouth every 8 (eight) hours as needed for up to 10 doses for nausea or vomiting. 10 tablet 0   pantoprazole (PROTONIX) 40 MG tablet Take  40 mg by mouth daily.     Pregabalin (LYRICA PO) Take by mouth.     SEREVENT DISKUS 50 MCG/DOSE diskus inhaler 1 puff every 12 (twelve) hours.     sildenafil (VIAGRA) 50 MG tablet Take 50 mg by mouth daily as needed.     SUMAtriptan (IMITREX) 100 MG tablet 1/2-1 TAB AT HEADACHE ONSET, CAN REPEAT ONCE IN 2 HOURS IF NEEDED NO MORE THAN 2 DOSES IN 24 HOURS.     topiramate (TOPAMAX) 50 MG tablet TAKE 1 TABLET BY MOUTH EVERY DAY     traZODone (DESYREL) 100 MG tablet Take 100 mg by mouth at bedtime.     VRAYLAR capsule Take 3 mg by mouth daily.     No current facility-administered medications for this visit.    Allergies as of 03/18/2021 - Review Complete 03/18/2021  Allergen Reaction Noted   Hydrocodone Other (See Comments) 06/13/2016     ROS:  General: Negative for anorexia, weight loss, fever, chills, fatigue, weakness. ENT: Negative for hoarseness, difficulty swallowing , nasal congestion. CV: Negative for chest pain, angina, palpitations, dyspnea on exertion, peripheral edema.  Respiratory: Negative for dyspnea at rest, dyspnea on exertion, cough, sputum, wheezing.  GI: See history of present illness. GU:  Negative for dysuria, hematuria, urinary incontinence, urinary frequency, nocturnal urination.  Endo: Negative for unusual weight change.    Physical Examination:   BP 131/80    Pulse 87    Temp 99 F (37.2 C) (Oral)    Wt 200 lb (90.7 kg)    BMI 30.41 kg/m   General: Well-nourished, well-developed in no acute distress.  Eyes: No icterus. Conjunctivae pink. Mouth: Oropharyngeal mucosa moist and pink , no lesions erythema or exudate. Neuro: Alert and oriented x 3.  Grossly intact. Skin: Warm and dry, no jaundice.   Psych: Alert and cooperative, normal mood and affect.  PROCEDURE NOTE: The patient presents with symptomatic grade 1 hemorrhoids, unresponsive to maximal medical therapy, requesting rubber band ligation of his/her hemorrhoidal disease.  All risks, benefits and alternative forms of therapy were described and informed consent was obtained.  In the Left Lateral Decubitus position (if anoscopy is performed) anoscopic examination revealed grade 1 hemorrhoids in the all position(s).   The decision was made to band the LL internal hemorrhoid, and the [place] was used to perform band ligation without complication.  Digital anorectal examination was then performed to assure proper positioning of the band, and to adjust the banded tissue as required.  The patient was discharged home without pain or other issues.  Dietary and behavioral recommendations were given and (if necessary - prescriptions were given), along with follow-up instructions.  The patient will return 4 weeks for follow-up and  possible additional banding as required.  No complications were encountered and the patient tolerated the procedure well.    Imaging Studies: No results found.  Assessment and Plan:   [name] is a 51 y.o. y/o male here to follow up for bright red blood per rectum. And  severe iron deficiency anemia.    Plan 1.  EGD with 24 hours of clears to ensure no mucosal lesions were missed because of anemia 2.  After we complete banding of hemorrhoids if does not help with anemia then may need capsule study of small bowel.  Banded the left lateral column today   I have discussed alternative options, risks & benefits,  which include, but are not limited to, bleeding, infection, perforation,respiratory complication & drug reaction.  The patient agrees with this plan & written consent will be obtained.     Dr [name]  MD,MRCP [facility]) Follow up in 4 weeks

## 2021-03-29 ENCOUNTER — Other Ambulatory Visit: Payer: Self-pay

## 2021-03-29 MED ORDER — CLENPIQ 10-3.5-12 MG-GM -GM/160ML PO SOLN: 320.0000 mL | ORAL | 0 refills | Status: DC

## 2021-03-30 ENCOUNTER — Encounter: Payer: Self-pay | Admitting: Gastroenterology

## 2021-03-30 ENCOUNTER — Ambulatory Visit: Payer: Medicare HMO | Admitting: Anesthesiology

## 2021-03-30 ENCOUNTER — Encounter
Admission: RE | Disposition: A | Discharge status: Home / Self Care | Payer: Self-pay | Source: Home / Self Care | Attending: Gastroenterology

## 2021-03-30 ENCOUNTER — Ambulatory Visit
Admission: RE | Admit: 2021-03-30 | Discharge: 2021-03-30 | Disposition: A | Discharge status: Home / Self Care | Payer: Medicare HMO | Attending: Gastroenterology | Admitting: Gastroenterology

## 2021-03-30 DIAGNOSIS — D509 Iron deficiency anemia, unspecified: Secondary | ICD-10-CM | POA: Insufficient documentation

## 2021-03-30 DIAGNOSIS — D508 Other iron deficiency anemias: Secondary | ICD-10-CM | POA: Diagnosis not present

## 2021-03-30 DIAGNOSIS — K2970 Gastritis, unspecified, without bleeding: Secondary | ICD-10-CM | POA: Diagnosis not present

## 2021-03-30 HISTORY — PX: ESOPHAGOGASTRODUODENOSCOPY (EGD) WITH PROPOFOL: SHX5813

## 2021-03-30 LAB — GLUCOSE, CAPILLARY: Glucose-Capillary: 102 mg/dL — ABNORMAL HIGH (ref 70–99)

## 2021-03-30 SURGERY — ESOPHAGOGASTRODUODENOSCOPY (EGD) WITH PROPOFOL
Anesthesia: General

## 2021-03-30 MED ORDER — SODIUM CHLORIDE 0.9 % IV SOLN: INTRAVENOUS | Status: DC

## 2021-03-30 MED ORDER — PROPOFOL 10 MG/ML IV BOLUS
INTRAVENOUS | Status: AC
  Filled 2021-03-30: qty 20

## 2021-03-30 MED ORDER — PROPOFOL 10 MG/ML IV BOLUS
INTRAVENOUS | Status: DC | PRN
  Administered 2021-03-30: 20 mg via INTRAVENOUS
  Administered 2021-03-30: 100 mg via INTRAVENOUS

## 2021-03-30 NOTE — Anesthesia Postprocedure Evaluation (Signed)
Anesthesia Post Note  Patient: [name]  Procedure(s) Performed: ESOPHAGOGASTRODUODENOSCOPY (EGD) WITH PROPOFOL  Patient location during evaluation: Endoscopy Anesthesia Type: General Level of consciousness: awake and alert Pain management: pain level controlled Vital Signs Assessment: post-procedure vital signs reviewed and stable Respiratory status: spontaneous breathing, nonlabored ventilation, respiratory function stable and patient connected to nasal cannula oxygen Cardiovascular status: blood pressure returned to baseline and stable Postop Assessment: no apparent nausea or vomiting Anesthetic complications: no   No notable events documented.   Last Vitals:  Vitals:   03/30/21 1146 03/30/21 1153  BP: 112/73 120/78  Pulse:    Resp: 12 15  Temp:    SpO2: 98% 99%    Last Pain:  Vitals:   03/30/21 1203  TempSrc:   PainSc: 0-No pain                 Arita Miss

## 2021-03-30 NOTE — Anesthesia Preprocedure Evaluation (Signed)
Anesthesia Evaluation  Patient identified by MRN, date of birth, ID band Patient awake    Reviewed: Allergy & Precautions, NPO status , Patient's Chart, lab work & pertinent test results  History of Anesthesia Complications Negative for: history of anesthetic complications  Airway Mallampati: IV  TM Distance: >3 FB Neck ROM: full    Dental  (+) Chipped, Poor Dentition, Missing   Pulmonary sleep apnea and Continuous Positive Airway Pressure Ventilation , COPD,  COPD inhaler, Current Smoker and Patient abstained from smoking.,    Pulmonary exam normal breath sounds clear to auscultation       Cardiovascular Exercise Tolerance: Good hypertension, (-) angina(-) Past MI and (-) DOE Normal cardiovascular exam Rhythm:Regular Rate:Normal - Systolic murmurs    Neuro/Psych  Headaches, PSYCHIATRIC DISORDERS Anxiety Depression  Neuromuscular disease    GI/Hepatic Neg liver ROS, GERD  Controlled and Medicated,  Endo/Other  diabetes, Type 2  Renal/GU Renal disease  negative genitourinary   Musculoskeletal   Abdominal   Peds  Hematology negative hematology ROS (+)   Anesthesia Other Findings Past Medical History: No date: Anxiety No date: Chronic back pain No date: Chronic neck pain No date: COPD (chronic obstructive pulmonary disease) (HCC) No date: Depression No date: Diabetes mellitus without complication (HCC) No date: Enlarged heart     Comment:  pt report Left: Enlarged kidney No date: GERD (gastroesophageal reflux disease) No date: Headache, migraine No date: Hypertension No date: Sleep apnea  Past Surgical History: No date: CIRCUMCISION 11/01/2016: COLONOSCOPY WITH PROPOFOL; N/A     Comment:  Procedure: COLONOSCOPY WITH PROPOFOL;  Surgeon: [name], MD;  Location: [facility] ENDOSCOPY;  Service:               Gastroenterology;  Laterality: N/A; 09/14/2018: COLONOSCOPY WITH PROPOFOL; N/A     Comment:   Procedure: COLONOSCOPY WITH PROPOFOL;  Surgeon: [name], MD;  Location: [facility] ENDOSCOPY;  Service:               Gastroenterology;  Laterality: N/A;  BMI    Body Mass Index: 27.98 kg/m      Reproductive/Obstetrics negative OB ROS                             Anesthesia Physical  Anesthesia Plan  ASA: 3  Anesthesia Plan: General   Post-op Pain Management: Minimal or no pain anticipated   Induction: Intravenous  PONV Risk Score and Plan: 1 and Propofol infusion and TIVA  Airway Management Planned: Natural Airway and Nasal Cannula  Additional Equipment: None  Intra-op Plan:   Post-operative Plan:   Informed Consent: I have reviewed the patients History and Physical, chart, labs and discussed the procedure including the risks, benefits and alternatives for the proposed anesthesia with the patient or authorized representative who has indicated his/her understanding and acceptance.     Dental Advisory Given  Plan Discussed with: Anesthesiologist, CRNA and Surgeon  Anesthesia Plan Comments: (Patient consented for risks of anesthesia including but not limited to:  - adverse reactions to medications - risk of airway placement if required - damage to eyes, teeth, lips or other oral mucosa - nerve damage due to positioning  - sore throat or hoarseness - Damage to heart, brain, nerves, lungs, other parts of body or loss of life  Patient voiced understanding.)        Anesthesia Quick Evaluation

## 2021-03-30 NOTE — Op Note (Signed)
[facility] Gastroenterology Patient Name: [name] Procedure Date: 03/30/2021 11:31 AM MRN: [id number] Account #: [id number] Date of Birth: [birth year] Admit Type: Outpatient Age: 51 Room: [facility] ENDO ROOM 2 Gender: Male Note Status: Finalized Instrument Name: Upper Endoscope [id number] Procedure:             Upper GI endoscopy Indications:           Iron deficiency anemia Providers:             [name] MD, MD Referring MD:          Elyse Jarvis Revelo (Referring MD) Medicines:             Monitored Anesthesia Care Complications:         No immediate complications. Procedure:             Pre-Anesthesia Assessment:                        - Prior to the procedure, a History and Physical was                         performed, and patient medications, allergies and                         sensitivities were reviewed. The patient's tolerance                         of previous anesthesia was reviewed.                        - The risks and benefits of the procedure and the                         sedation options and risks were discussed with the                         patient. All questions were answered and informed                         consent was obtained.                        - ASA Grade Assessment: II - A patient with mild                         systemic disease.                        After obtaining informed consent, the endoscope was                         passed under direct vision. Throughout the procedure,                         the patient's blood pressure, pulse, and oxygen                         saturations were monitored continuously. The Endoscope                         was  introduced through the mouth, and advanced to the                         third part of duodenum. The upper GI endoscopy was                         accomplished with ease. The patient tolerated the                         procedure well. Findings:      The  esophagus was normal.      The examined duodenum was normal.      Patchy moderate inflammation characterized by congestion (edema) and       erythema was found on the greater curvature of the stomach and in the       gastric antrum. Biopsies were taken with a cold forceps for histology.      The cardia and gastric fundus were normal on retroflexion. Impression:            - Normal esophagus.                        - Normal examined duodenum.                        - Gastritis. Biopsied. Recommendation:        - Await pathology results.                        - Discharge patient to home (with escort).                        - Resume previous diet.                        - Continue present medications.                        - Return to my office as previously scheduled. Procedure Code(s):     --- Professional ---                        [phone], Esophagogastroduodenoscopy, flexible,                         transoral; with biopsy, single or multiple Diagnosis Code(s):     --- Professional ---                        K29.70, Gastritis, unspecified, without bleeding                        D50.9, Iron deficiency anemia, unspecified CPT copyright 2019 American Medical Association. All rights reserved. The codes documented in this report are preliminary and upon coder review may  be revised to meet current compliance requirements. [name], MD [name] MD, MD 03/30/2021 11:40:01 AM This report has been signed electronically. Number of Addenda: 0 Note Initiated On: 03/30/2021 11:31 AM Estimated Blood Loss:  Estimated blood loss: none.      [facility]

## 2021-03-30 NOTE — Transfer of Care (Signed)
Immediate Anesthesia Transfer of Care Note  Patient: [name]  Procedure(s) Performed: ESOPHAGOGASTRODUODENOSCOPY (EGD) WITH PROPOFOL  Patient Location: PACU and Endoscopy Unit  Anesthesia Type:General  Level of Consciousness: drowsy and patient cooperative  Airway & Oxygen Therapy: Patient Spontanous Breathing  Post-op Assessment: Report given to RN and Post -op Vital signs reviewed and stable  Post vital signs: Reviewed and stable  Last Vitals:  Vitals Value Taken Time  BP 112/73 03/30/21 1146  Temp    Pulse    Resp 12 03/30/21 1146  SpO2 98 % 03/30/21 1146    Last Pain:  Vitals:   03/30/21 1044  TempSrc: Temporal  PainSc: 0-No pain         Complications: No notable events documented.

## 2021-03-30 NOTE — H&P (Signed)
Steven Bellows, MD [address], [place], [place], [place], [address] [address], [place], [place], [place], [address] Phone: [phone]  Fax: [phone]  Primary Care Physician:  Theotis Burrow, MD   Pre-Procedure History & Physical: HPI:  [name] is a 51 y.o. male is here for an endoscopy    Past Medical History:  Diagnosis Date   Anxiety    Chronic back pain    Chronic neck pain    COPD (chronic obstructive pulmonary disease) ([place])    Depression    Diabetes mellitus without complication ([place])    Enlarged heart    pt report   Enlarged kidney Left   GERD (gastroesophageal reflux disease)    Headache, migraine    Hypertension    Sleep apnea     Past Surgical History:  Procedure Laterality Date   CIRCUMCISION     COLONOSCOPY WITH PROPOFOL N/A 11/01/2016   Procedure: COLONOSCOPY WITH PROPOFOL;  Surgeon: Steven Bellows, MD;  Location: [facility] ENDOSCOPY;  Service: Gastroenterology;  Laterality: N/A;   COLONOSCOPY WITH PROPOFOL N/A 09/14/2018   Procedure: COLONOSCOPY WITH PROPOFOL;  Surgeon: Steven Bellows, MD;  Location: [facility] ENDOSCOPY;  Service: Gastroenterology;  Laterality: N/A;   COLONOSCOPY WITH PROPOFOL N/A 12/17/2020   Procedure: COLONOSCOPY WITH PROPOFOL;  Surgeon: Steven Bellows, MD;  Location: [facility] ENDOSCOPY;  Service: Gastroenterology;  Laterality: N/A;   ESOPHAGOGASTRODUODENOSCOPY N/A 12/17/2020   Procedure: ESOPHAGOGASTRODUODENOSCOPY (EGD);  Surgeon: Steven Bellows, MD;  Location: [facility] ENDOSCOPY;  Service: Gastroenterology;  Laterality: N/A;    Prior to Admission medications   Medication Sig Start Date End Date Taking? Authorizing Provider  cariprazine (VRAYLAR) capsule Take 1.5 mg by mouth daily. At bedtime   Yes [provider]  ferrous sulfate 325 (65 FE) MG tablet Take 325 mg by mouth 2 (two) times daily. 11/24/20  Yes [provider]  FLUoxetine (PROZAC) 40 MG capsule Take 40 mg by mouth daily. 03/27/20  Yes [provider]  gabapentin (NEURONTIN) 300 MG capsule Take 300 mg by mouth 3 (three) times daily. 04/11/20  Yes [provider]  JARDIANCE 10 MG TABS tablet Take 10 mg by mouth daily. 11/09/20  Yes [provider]  LANTUS SOLOSTAR 100 UNIT/ML Solostar Pen SMARTSIG:15 Unit(s) SUB-Q Every Night 09/20/20  Yes [provider]  LEVEMIR FLEXTOUCH 100 UNIT/ML FlexPen Inject 15 Units into the skin at bedtime. 04/21/20  Yes [provider]  lisinopril (ZESTRIL) 40 MG tablet Hold until outpatient followup with your doctor due to dehydration and low blood pressure. 04/28/20  Yes Enzo Bi, MD  metFORMIN (GLUCOPHAGE) 500 MG tablet Take by mouth 2 (two) times daily with a meal.   Yes [provider]  metoprolol tartrate (LOPRESSOR) 25 MG tablet Hold until outpatient followup with your doctor due to dehydration and low blood pressure. 04/28/20  Yes Enzo Bi, MD  ondansetron (ZOFRAN) 4 MG tablet Take 1 tablet (4 mg total) by mouth every 8 (eight) hours as needed for up to 10 doses for nausea or vomiting. 09/14/19  Yes Lucrezia Starch, MD  pantoprazole (PROTONIX) 40 MG tablet Take 40 mg by mouth daily.   Yes [provider]  Pregabalin (LYRICA PO) Take by mouth.   Yes [provider]  topiramate (TOPAMAX) 50 MG tablet TAKE 1 TABLET BY MOUTH EVERY DAY 09/04/18  Yes [provider]  traZODone (DESYREL) 100 MG tablet Take 100 mg by mouth at bedtime. 03/27/20  Yes [provider]  VRAYLAR capsule  Take 3 mg by mouth daily. 04/11/20  Yes [provider]  ACCU-CHEK GUIDE test strip USE 1 STRIP AS DIRECTED THREE TIMES A DAY 10/25/20   [provider]  B-D UF III MINI PEN NEEDLES 31G X 5 MM MISC Inject into the skin. 11/10/20   [provider]  Blood Glucose Monitoring Suppl (ACCU-CHEK GUIDE ME) w/Device KIT USE 1 AS DIRECTED AS DIRECTED THREE TIMES A DAY 07/03/20   [provider]  SEREVENT DISKUS 50 MCG/DOSE diskus inhaler 1 puff  every 12 (twelve) hours. Patient not taking: Reported on 03/30/2021 02/03/20   [provider]  sildenafil (VIAGRA) 50 MG tablet Take 50 mg by mouth daily as needed. 08/05/20   [provider]  Sod Picosulfate-Mag Ox-Cit Acd (CLENPIQ) 10-3.5-12 MG-GM -GM/160ML SOLN Take 320 mLs by mouth as directed. 03/29/21   Steven Bellows, MD  SUMAtriptan (IMITREX) 100 MG tablet 1/2-1 TAB AT HEADACHE ONSET, CAN REPEAT ONCE IN 2 HOURS IF NEEDED NO MORE THAN 2 DOSES IN 24 HOURS. Patient not taking: Reported on 03/30/2021 12/25/19   [provider]    Allergies as of 03/18/2021 - Review Complete 03/18/2021  Allergen Reaction Noted   Hydrocodone Other (See Comments) 06/13/2016    Family History  Problem Relation Age of Onset   Diabetes Mother    Hypertension Mother    Hypertension Father     Social History   Socioeconomic History   Marital status: Married    Spouse name: Not on file   Number of children: Not on file   Years of education: Not on file   Highest education level: Not on file  Occupational History   Not on file  Tobacco Use   Smoking status: Some Days    Types: Cigars   Smokeless tobacco: Former  Scientific laboratory technician Use: Some days  Substance and Sexual Activity   Alcohol use: No    Comment: rare   Drug use: No   Sexual activity: Not on file  Other Topics Concern   Not on file  Social History Narrative   Not on file   Social Determinants of Health   Financial Resource Strain: Not on file  Food Insecurity: Not on file  Transportation Needs: Not on file  Physical Activity: Not on file  Stress: Not on file  Social Connections: Not on file  Intimate Partner Violence: Not on file    Review of Systems: See HPI, otherwise negative ROS  Physical Exam: BP (!) 139/93    Pulse 71    Temp (!) 96.8 F (36 C) (Temporal)    Resp 18    Ht '5\' 8"'  (1.727 m)    Wt 90.7 kg    SpO2 100%    BMI 30.41 kg/m  General:   Alert,  pleasant and cooperative in NAD Head:   Normocephalic and atraumatic. Neck:  Supple; no masses or thyromegaly. Lungs:  Clear throughout to auscultation, normal respiratory effort.    Heart:  +S1, +S2, Regular rate and rhythm, No edema. Abdomen:  Soft, nontender and nondistended. Normal bowel sounds, without guarding, and without rebound.   Neurologic:  Alert and  oriented x4;  grossly normal neurologically.  Impression/Plan: [name] is here for an endoscopy  to be performed for  evaluation of anemia    Risks, benefits, limitations, and alternatives regarding endoscopy have been reviewed with the patient.  Questions have been answered.  All parties agreeable.   Steven Bellows, MD  03/30/2021, 10:58 AM

## 2021-03-31 ENCOUNTER — Encounter: Payer: Self-pay | Admitting: Gastroenterology

## 2021-03-31 LAB — SURGICAL PATHOLOGY

## 2021-04-08 ENCOUNTER — Encounter: Payer: Self-pay | Admitting: Gastroenterology

## 2021-04-13 ENCOUNTER — Encounter: Payer: Self-pay | Admitting: Gastroenterology

## 2021-04-13 ENCOUNTER — Ambulatory Visit (INDEPENDENT_AMBULATORY_CARE_PROVIDER_SITE_OTHER): Payer: Medicare HMO | Admitting: Gastroenterology

## 2021-04-13 ENCOUNTER — Other Ambulatory Visit: Payer: Self-pay

## 2021-04-13 VITALS — BP 105/73 | HR 88 | Temp 99.1°F | Wt 193.6 lb

## 2021-04-13 DIAGNOSIS — K648 Other hemorrhoids: Secondary | ICD-10-CM | POA: Diagnosis not present

## 2021-04-13 DIAGNOSIS — K625 Hemorrhage of anus and rectum: Secondary | ICD-10-CM

## 2021-04-13 NOTE — Progress Notes (Signed)
Patient follow-ups today for banding of hemorrhoids ? ? ? ?Summary of history : ?Was seen on 12/09/2020 for rectal bleeding on and off for months with blood on the toilet bowl.  Colonoscopy on 09/14/2018 showed 3 sessile polyps 4 to 6 mm in size resected.  Diverticulosis of sigmoid colon.  One was a tubular adenoma and 2 of the polyps were hyperplastic.  Biopsies of some of the colonic mucosal fragment showed focal mild active colitis.  At that point of time plan was to perform banding of internal hemorrhoids.  Patient did not follow-up. ?  ? 12/09/2020 ferritin 37-36, urinalysis showed no blood, celiac serology was negative and H. pylori breath test was negative.  Hemoglobin had improved to 12.4 g from 8 g ?12/17/2020: EGD: Large amount of food was seen in the stomach and repeat procedure suggested ?Colonoscopy was performed in same-day 2 polyps 3 to 5 mm in size were resected and nonbleeding large internal hemorrhoids were noted.  Plan was to repeat EGD, capsule study of small bowel in 2 weeks and banding of hemorrhoids ? ?Failed conservative management of internal hemorroids  ? ? ?First round:LL column of hemorrhoids banded  ? ? ?Interval history   03/18/2021-04/13/2021 ? ?03/30/2021: EGD : gastritis noted: bx showed reactive gastritis ?Did very well after last banding , significant drop in rectal bleeding ? ? ?Digital rectal exam performed in the presence of a chaperone. ?External anal findings: none  ?Internal findings: , No masses, no blood on glove noticed. ? ? ? ?PROCEDURE NOTE: ?The patient presents with symptomatic grade 1 hemorrhoids, unresponsive to maximal medical therapy, requesting rubber band ligation of his/her hemorrhoidal disease.  All risks, benefits and alternative forms of therapy were described and informed consent was obtained. ? ?In the Left Lateral Decubitus position (if anoscopy is performed) anoscopic examination revealed grade 1 hemorrhoids in the RA and RP position(s).  ? ?The decision was made  to band the RA internal hemorrhoid, and the [place] O?Regan System was used to perform band ligation without complication.  Digital anorectal examination was then performed to assure proper positioning of the band, and to adjust the banded tissue as required.  The patient was discharged home without pain or other issues.  Dietary and behavioral recommendations were given and (if necessary - prescriptions were given), along with follow-up instructions.  The patient will return 4 weeks for follow-up and possible additional banding as required. ? ?No complications were encountered and the patient tolerated the procedure well. ? ?[name] is a 51 y.o. y/o male here to follow up for bright red blood per rectum. And  severe iron deficiency anemia.   ? ?Plan ?1.  After we complete banding of hemorrhoids if does not help with anemia then may need capsule study of small bowel.   ? ?Follow-up:4 weeks  ? ?Dr [name] MD,MRCP [facility]) ?Gastroenterology/Hepatology ?Pager: [phone] ?  ?

## 2021-05-12 ENCOUNTER — Ambulatory Visit: Payer: Medicare HMO | Admitting: Gastroenterology

## 2021-05-12 ENCOUNTER — Encounter: Payer: Self-pay | Admitting: Gastroenterology

## 2021-05-12 VITALS — BP 128/80 | HR 89 | Temp 99.1°F | Ht 68.0 in | Wt 184.6 lb

## 2021-05-12 DIAGNOSIS — K625 Hemorrhage of anus and rectum: Secondary | ICD-10-CM | POA: Diagnosis not present

## 2021-05-12 DIAGNOSIS — K648 Other hemorrhoids: Secondary | ICD-10-CM

## 2021-05-12 NOTE — Progress Notes (Signed)
Patient follow-ups today for banding of hemorrhoids ? ? ? ?Summary of history : ? ?Was seen on 12/09/2020 for rectal bleeding on and off for months with blood on the toilet bowl.  Colonoscopy on 09/14/2018 showed 3 sessile polyps 4 to 6 mm in size resected.  Diverticulosis of sigmoid colon.  One was a tubular adenoma and 2 of the polyps were hyperplastic.  Biopsies of some of the colonic mucosal fragment showed focal mild active colitis.  At that point of time plan was to perform banding of internal hemorrhoids.  Patient did not follow-up. ?  ? 12/09/2020 ferritin 37-36, urinalysis showed no blood, celiac serology was negative and H. pylori breath test was negative.  Hemoglobin had improved to 12.4 g from 8 g ?12/17/2020: EGD: Large amount of food was seen in the stomach and repeat procedure suggested ?Colonoscopy was performed in same-day 2 polyps 3 to 5 mm in size were resected and nonbleeding large internal hemorrhoids were noted.  Plan was to repeat EGD, capsule study of small bowel in 2 weeks and banding of hemorrhoids ?03/30/2021: EGD : gastritis noted: bx showed reactive gastritis  ? ? ?Failed conservative management of internal hemorroids  ?  ?  ?First round:LL column of hemorrhoids banded  ?Second round: 04/13/2021: Right anterior column banded ?  ?Interval history  04/13/2021 ?  ? ?Did very well after first round of banding but after second on the bleeding last 2 weeks he has had rectal bleeding. ?  ? ? ?Digital rectal exam performed in the presence of a chaperone. ?External anal findings: None ?Internal findings: , No masses, no blood on glove noticed. ? ? ? ?PROCEDURE NOTE: ?The patient presents with symptomatic grade 1 hemorrhoids, unresponsive to maximal medical therapy, requesting rubber band ligation of his/her hemorrhoidal disease.  All risks, benefits and alternative forms of therapy were described and informed consent was obtained. ? ?In the Left Lateral Decubitus position (if anoscopy is performed)  anoscopic examination revealed grade 1 hemorrhoids in the RA and RP position(s).  ? ?The decision was made to band the RP internal hemorrhoid, and the [place] O?Regan System was used to perform band ligation without complication.  Digital anorectal examination was then performed to assure proper positioning of the band, and to adjust the banded tissue as required.  The patient was discharged home without pain or other issues.  Dietary and behavioral recommendations were given and (if necessary - prescriptions were given), along with follow-up instructions.  The patient will return as needed for follow-up and possible additional banding as required. ? ?No complications were encountered and the patient tolerated the procedure well. ? ? ?Plan: ? ?Avoid constipation.  Commence on stool softeners if not already on.  Explained that if he has further bleeding we could put another rubber band on the right anterior column but if that fails then we need to take about embolization versus surgery ?2.check CBC ?Follow-up:as needed ? ?Dr [name] MD,MRCP [facility]) ?Gastroenterology/Hepatology ?Pager: [phone] ?  ?

## 2021-05-13 LAB — CBC WITH DIFFERENTIAL/PLATELET
Basophils Absolute: 0 10*3/uL (ref 0.0–0.2)
Basos: 0 %
EOS (ABSOLUTE): 0.2 10*3/uL (ref 0.0–0.4)
Eos: 2 %
Hematocrit: 39.8 % (ref 37.5–51.0)
Hemoglobin: 12.6 g/dL — ABNORMAL LOW (ref 13.0–17.7)
Immature Grans (Abs): 0 10*3/uL (ref 0.0–0.1)
Immature Granulocytes: 0 %
Lymphocytes Absolute: 3.2 10*3/uL — ABNORMAL HIGH (ref 0.7–3.1)
Lymphs: 36 %
MCH: 26 pg — ABNORMAL LOW (ref 26.6–33.0)
MCHC: 31.7 g/dL (ref 31.5–35.7)
MCV: 82 fL (ref 79–97)
Monocytes Absolute: 0.7 10*3/uL (ref 0.1–0.9)
Monocytes: 8 %
Neutrophils Absolute: 4.7 10*3/uL (ref 1.4–7.0)
Neutrophils: 54 %
Platelets: 416 10*3/uL (ref 150–450)
RBC: 4.85 x10E6/uL (ref 4.14–5.80)
RDW: 14.5 % (ref 11.6–15.4)
WBC: 8.9 10*3/uL (ref 3.4–10.8)

## 2021-06-14 ENCOUNTER — Ambulatory Visit: Payer: Medicare HMO | Admitting: Gastroenterology

## 2021-06-14 ENCOUNTER — Encounter: Payer: Self-pay | Admitting: Gastroenterology

## 2021-06-14 VITALS — BP 138/96 | HR 97 | Temp 98.6°F | Ht 68.0 in | Wt 182.2 lb

## 2021-06-14 DIAGNOSIS — K648 Other hemorrhoids: Secondary | ICD-10-CM

## 2021-06-14 NOTE — Progress Notes (Signed)
Patient follow-ups today for banding of hemorrhoids ? ? ? ?Summary of history : ?Was seen on 12/09/2020 for rectal bleeding on and off for months with blood on the toilet bowl.  Colonoscopy on 09/14/2018 showed 3 sessile polyps 4 to 6 mm in size resected.  Diverticulosis of sigmoid colon.  One was a tubular adenoma and 2 of the polyps were hyperplastic.  Biopsies of some of the colonic mucosal fragment showed focal mild active colitis.  At that point of time plan was to perform banding of internal hemorrhoids.  Patient did not follow-up. ?  ? 12/09/2020 ferritin 37-36, urinalysis showed no blood, celiac serology was negative and H. pylori breath test was negative.  Hemoglobin had improved to 12.4 g from 8 g ?12/17/2020: EGD: Large amount of food was seen in the stomach and repeat procedure suggested ?Colonoscopy was performed in same-day 2 polyps 3 to 5 mm in size were resected and nonbleeding large internal hemorrhoids were noted.  Plan was to repeat EGD, capsule study of small bowel in 2 weeks and banding of hemorrhoids ?03/30/2021: EGD : gastritis noted: bx showed reactive gastritis  ? Failed conservative management of internal hemorroids  ?  ?  ?First round:LL column of hemorrhoids banded  ?Second round: 04/13/2021: Right anterior column banded ?Third round 05/12/2021: RP column banded  ? ?Interval history   05/12/2021-06/14/2021 ? ?Initial 3 weeks after the last banding had no bleeding but then had some bleeding last 3 days.  Overall he has seen a significant improvement.  Would like to proceed with another round of banding. ? ?Digital rectal exam performed in the presence of a chaperone. ?External anal findings: None ?Internal findings: None, No masses, no blood on glove noticed. ? ? ? ?PROCEDURE NOTE: ?The patient presents with symptomatic grade 1 hemorrhoids, unresponsive to maximal medical therapy, requesting rubber band ligation of his/her hemorrhoidal disease.  All risks, benefits and alternative forms of therapy were  described and informed consent was obtained. ? ?In the Left Lateral Decubitus position (if anoscopy is performed) anoscopic examination revealed grade 1 hemorrhoids in the RA position(s).  ? ?The decision was made to band the RA internal hemorrhoid, and the [place] O?Regan System was used to perform band ligation without complication.  Digital anorectal examination was then performed to assure proper positioning of the band, and to adjust the banded tissue as required.  The patient was discharged home without pain or other issues.  Dietary and behavioral recommendations were given and (if necessary - prescriptions were given), along with follow-up instructions.  The patient will return as needed for follow-up and possible additional banding as required. ? ?No complications were encountered and the patient tolerated the procedure well. ? ? ?Plan: ? ?Avoid constipation.  Commence on stool softeners if not already on ? ?Follow-up: As needed ? ?Dr [name] MD,MRCP [facility]) ?Gastroenterology/Hepatology ?Pager: [phone] ? ? ?

## 2021-11-16 ENCOUNTER — Ambulatory Visit (INDEPENDENT_AMBULATORY_CARE_PROVIDER_SITE_OTHER): Payer: Medicare HMO | Admitting: Gastroenterology

## 2021-11-16 ENCOUNTER — Encounter: Payer: Self-pay | Admitting: Gastroenterology

## 2021-11-16 ENCOUNTER — Other Ambulatory Visit: Payer: Self-pay

## 2021-11-16 VITALS — BP 125/80 | HR 89 | Temp 98.9°F | Ht 68.0 in | Wt 173.4 lb

## 2021-11-16 DIAGNOSIS — K625 Hemorrhage of anus and rectum: Secondary | ICD-10-CM

## 2021-11-16 DIAGNOSIS — K648 Other hemorrhoids: Secondary | ICD-10-CM | POA: Diagnosis not present

## 2021-11-16 NOTE — Progress Notes (Signed)
[name] MD, MRCP(U.K) [address]  [place]  [place], [place] [address]  Main: [phone]  Fax: [phone]   Primary Care Physician: Theotis Burrow, MD  Primary Gastroenterologist:  Dr. [name]   Chief Complaint  Patient presents with   Rectal Bleeding    HPI: [name] is a 51 y.o. male    Summary of history :  He was last seen in my office in May 2023 for hemorrhoidal bandinghospital/o rectal bleeding on and off for months  Colonoscopy on 09/14/2018 showed 3 sessile polyps 4 to 6 mm in size resected.  Diverticulosis of sigmoid colon.  One was a tubular adenoma and 2 of the polyps were hyperplastic.  Biopsies of some of the colonic mucosal fragment showed focal mild active colitis.  At that point of time plan was to perform banding of internal hemorrhoids.  Patient did not follow-up.   12/09/2020 ferritin 37-36, urinalysis showed no blood, celiac serology was negative and H. pylori breath test was negative.  Hemoglobin had improved to 12.4 g from 8 g  12/17/2020: EGD: Large amount of food was seen in the stomach and repeat procedure suggested Colonoscopy was performed in same-day 2 polyps 3 to 5 mm in size were resected and nonbleeding large internal hemorrhoids were noted.  Plan was to repeat EGD, capsule study of small bowel in 2 weeks and banding of hemorrhoids 03/30/2021: EGD : gastritis noted: bx showed reactive gastritis     Failed conservative management of internal hemorroids      First round:LL column of hemorrhoids banded  Second round: 04/13/2021: Right anterior column banded Third round 05/12/2021: RP column banded  Fourth round of banding right anterior column on 06/14/2021  Interval history   06/14/2021-11/16/2021   03/29/2021 [facility] ER rectal bleeding, vomiting underwent a CT scan of the abdomen which showed sigmoid wall thickening with scattered diverticula suggesting sequela of chronic diverticulitis no evidence of acute  diverticulitis.  In addition mild diffuse stranding of the ascending and transverse colon which may represent transverse colitis.  Clinical impression was bleeding from diverticulosis or internal hemorrhoid.  Hemoglobin was 12.6 grams back in 05/2021 Presently has some blood on the tissue paper every time he wipes    PROCEDURE NOTE: The patient presents with symptomatic grade 1 hemorrhoids, unresponsive to maximal medical therapy, requesting rubber band ligation of his/her hemorrhoidal disease.  All risks, benefits and alternative forms of therapy were described and informed consent was obtained.  In the Left Lateral Decubitus position (if anoscopy is performed) anoscopic examination revealed grade 1 hemorrhoids in the RA position(s).   The decision was made to band the RA internal hemorrhoid, and the [place] was used to perform band ligation without complication.  Digital anorectal examination was then performed to assure proper positioning of the band, and to adjust the banded tissue as required.  The patient was discharged home without pain or other issues.  Dietary and behavioral recommendations were given and (if necessary - prescriptions were given), along with follow-up instructions.  The patient will return 4 weeks for follow-up and possible additional banding as required.  No complications were encountered and the patient tolerated the procedure well.    Current Outpatient Medications  Medication Sig Dispense Refill   ACCU-CHEK GUIDE test strip USE 1 STRIP AS DIRECTED THREE TIMES A DAY     B-D UF III MINI PEN NEEDLES 31G X 5 MM MISC Inject into the skin.     Blood Glucose  Monitoring Suppl (ACCU-CHEK GUIDE ME) w/Device KIT USE 1 AS DIRECTED AS DIRECTED THREE TIMES A DAY     cariprazine (VRAYLAR) capsule Take 1.5 mg by mouth daily. At bedtime     ferrous sulfate 325 (65 FE) MG tablet Take 325 mg by mouth 2 (two) times daily.     FLUoxetine (PROZAC) 40 MG capsule Take 40 mg by  mouth daily.     gabapentin (NEURONTIN) 300 MG capsule Take 300 mg by mouth 3 (three) times daily.     JARDIANCE 10 MG TABS tablet Take 10 mg by mouth daily.     LANTUS SOLOSTAR 100 UNIT/ML Solostar Pen SMARTSIG:15 Unit(s) SUB-Q Every Night     LEVEMIR FLEXTOUCH 100 UNIT/ML FlexPen Inject 15 Units into the skin at bedtime.     lisinopril (ZESTRIL) 40 MG tablet Hold until outpatient followup with your doctor due to dehydration and low blood pressure.     metFORMIN (GLUCOPHAGE) 500 MG tablet Take by mouth 2 (two) times daily with a meal.     metoprolol tartrate (LOPRESSOR) 25 MG tablet Hold until outpatient followup with your doctor due to dehydration and low blood pressure.     ondansetron (ZOFRAN-ODT) 4 MG disintegrating tablet Take 1 tablet by mouth.     pantoprazole (PROTONIX) 40 MG tablet Take 40 mg by mouth daily.     Pregabalin (LYRICA PO) Take by mouth.     SEREVENT DISKUS 50 MCG/DOSE diskus inhaler 1 puff every 12 (twelve) hours.     topiramate (TOPAMAX) 50 MG tablet TAKE 1 TABLET BY MOUTH EVERY DAY     traZODone (DESYREL) 100 MG tablet Take 100 mg by mouth at bedtime.     VRAYLAR capsule Take 3 mg by mouth daily.     sildenafil (VIAGRA) 50 MG tablet Take 50 mg by mouth daily as needed. (Patient not taking: Reported on 11/16/2021)     No current facility-administered medications for this visit.    Allergies as of 11/16/2021 - Review Complete 11/16/2021  Allergen Reaction Noted   Hydrocodone Other (See Comments) 06/13/2016    ROS:  General: Negative for anorexia, weight loss, fever, chills, fatigue, weakness. ENT: Negative for hoarseness, difficulty swallowing , nasal congestion. CV: Negative for chest pain, angina, palpitations, dyspnea on exertion, peripheral edema.  Respiratory: Negative for dyspnea at rest, dyspnea on exertion, cough, sputum, wheezing.  GI: See history of present illness. GU:  Negative for dysuria, hematuria, urinary incontinence, urinary frequency,  nocturnal urination.  Endo: Negative for unusual weight change.    Physical Examination:   BP 125/80   Pulse 89   Temp 98.9 F (37.2 C) (Oral)   Ht '5\' 8"'  (1.727 m)   Wt 173 lb 6.4 oz (78.7 kg)   BMI 26.37 kg/m   General: Well-nourished, well-developed in no acute distress.  Eyes: No icterus. Conjunctivae pink. Neuro: Alert and oriented x 3.  Grossly intact. Skin: Warm and dry, no jaundice.   Psych: Alert and cooperative, normal mood and affect.   Imaging Studies: No results found.  Assessment and Plan:   [name] is a 51 y.o. y/o male with history of hemorrhoidal banding x4 in the past for rectal bleeding returns to the office with persistent bleeding after the last episode of banding which had resolved all bleeding but has since recurred.  Large prolapsing hemorrhoid was seen in the right anterior position today which was banded    Dr [name]  MD,MRCP [facility]) Follow up in 4 weeks

## 2021-12-21 ENCOUNTER — Encounter: Payer: Self-pay | Admitting: Gastroenterology

## 2021-12-21 ENCOUNTER — Other Ambulatory Visit: Payer: Self-pay

## 2021-12-21 ENCOUNTER — Ambulatory Visit (INDEPENDENT_AMBULATORY_CARE_PROVIDER_SITE_OTHER): Payer: Medicare HMO | Admitting: Gastroenterology

## 2021-12-21 VITALS — BP 154/94 | HR 102 | Temp 99.7°F | Wt 175.4 lb

## 2021-12-21 DIAGNOSIS — K648 Other hemorrhoids: Secondary | ICD-10-CM | POA: Diagnosis not present

## 2021-12-21 NOTE — Progress Notes (Signed)
Patient follow-ups today for banding of hemorrhoids    Summary of history :  He is here for MRI to banding.  She has had 4 rounds of hemorrhoidal banding.  Last was in October 2023.  He has noticed a significant improvement from his last episode of hemorrhoidal banding.     Digital rectal exam performed in the presence of a chaperone. External anal findings: normal Internal findings: , No masses, no blood on glove noticed.    PROCEDURE NOTE: The patient presents with symptomatic grade 2 hemorrhoids, unresponsive to maximal medical therapy, requesting rubber band ligation of his/her hemorrhoidal disease.  All risks, benefits and alternative forms of therapy were described and informed consent was obtained.  In the Left Lateral Decubitus position (if anoscopy is performed) anoscopic examination revealed grade 2 hemorrhoids in the RA position(s).   The decision was made to band the RA internal hemorrhoid, and the [place] was used to perform band ligation without complication.  Digital anorectal examination was then performed to assure proper positioning of the band, and to adjust the banded tissue as required.  The patient was discharged home without pain or other issues.  Dietary and behavioral recommendations were given and (if necessary - prescriptions were given), along with follow-up instructions.  The patient will return  as needed for follow-up and possible additional banding as required.  No complications were encountered and the patient tolerated the procedure well.   Plan:  Avoid constipation.  Commence on stool softeners if not already on.  Explained that if the issue continues he may require surgery  Follow-up: As needed  Dr [name] MD,MRCP [facility]) Gastroenterology/Hepatology Pager: [phone]

## 2021-12-21 NOTE — Patient Instructions (Signed)
Please give [place] a call in case you start having heavy bleeding again so Dr. Vicente Males could do one more hemorrhoid banding. Happy Thanksgiving and Happy Holidays to you and your family!

## 2022-06-27 ENCOUNTER — Telehealth: Payer: Self-pay

## 2022-06-27 NOTE — Telephone Encounter (Signed)
Returned call to patient for lung screening referral. Patient states he only smokes cigars - about 3 per day.  Never smoked cigarettes.  He is not eligible for LDCT due to no cigarette/pack years history. Patient is interested in LDCT and requests we update [facility] provider to see if alternative CT chest wo or other recommendations can be given.  Will fax office with information as provider not found in Epic for routing this note.  Steven Copper FNP was provider name given as he has changed providers recently)

## 2022-08-24 ENCOUNTER — Emergency Department
Admission: EM | Admit: 2022-08-24 | Discharge: 2022-08-24 | Disposition: A | Discharge status: Home / Self Care | Payer: Medicare HMO | Attending: Emergency Medicine | Admitting: Emergency Medicine

## 2022-08-24 ENCOUNTER — Other Ambulatory Visit: Payer: Self-pay

## 2022-08-24 DIAGNOSIS — J449 Chronic obstructive pulmonary disease, unspecified: Secondary | ICD-10-CM | POA: Insufficient documentation

## 2022-08-24 DIAGNOSIS — F1010 Alcohol abuse, uncomplicated: Secondary | ICD-10-CM

## 2022-08-24 DIAGNOSIS — Y900 Blood alcohol level of less than 20 mg/100 ml: Secondary | ICD-10-CM | POA: Diagnosis not present

## 2022-08-24 DIAGNOSIS — F4325 Adjustment disorder with mixed disturbance of emotions and conduct: Secondary | ICD-10-CM | POA: Diagnosis present

## 2022-08-24 DIAGNOSIS — Z79899 Other long term (current) drug therapy: Secondary | ICD-10-CM | POA: Insufficient documentation

## 2022-08-24 DIAGNOSIS — F17290 Nicotine dependence, other tobacco product, uncomplicated: Secondary | ICD-10-CM | POA: Insufficient documentation

## 2022-08-24 DIAGNOSIS — R45851 Suicidal ideations: Secondary | ICD-10-CM

## 2022-08-24 DIAGNOSIS — E119 Type 2 diabetes mellitus without complications: Secondary | ICD-10-CM | POA: Insufficient documentation

## 2022-08-24 DIAGNOSIS — F1910 Other psychoactive substance abuse, uncomplicated: Secondary | ICD-10-CM | POA: Insufficient documentation

## 2022-08-24 DIAGNOSIS — I10 Essential (primary) hypertension: Secondary | ICD-10-CM | POA: Insufficient documentation

## 2022-08-24 DIAGNOSIS — R456 Violent behavior: Secondary | ICD-10-CM | POA: Insufficient documentation

## 2022-08-24 DIAGNOSIS — F419 Anxiety disorder, unspecified: Secondary | ICD-10-CM | POA: Insufficient documentation

## 2022-08-24 DIAGNOSIS — R4689 Other symptoms and signs involving appearance and behavior: Secondary | ICD-10-CM

## 2022-08-24 DIAGNOSIS — R000 Tachycardia, unspecified: Secondary | ICD-10-CM | POA: Diagnosis not present

## 2022-08-24 DIAGNOSIS — F411 Generalized anxiety disorder: Secondary | ICD-10-CM | POA: Diagnosis present

## 2022-08-24 DIAGNOSIS — F1090 Alcohol use, unspecified, uncomplicated: Secondary | ICD-10-CM | POA: Insufficient documentation

## 2022-08-24 DIAGNOSIS — Z789 Other specified health status: Secondary | ICD-10-CM | POA: Insufficient documentation

## 2022-08-24 DIAGNOSIS — Z638 Other specified problems related to primary support group: Secondary | ICD-10-CM

## 2022-08-24 LAB — URINE DRUG SCREEN, QUALITATIVE (ARMC ONLY)
Amphetamines, Ur Screen: NOT DETECTED
Barbiturates, Ur Screen: NOT DETECTED
Benzodiazepine, Ur Scrn: NOT DETECTED
Cannabinoid 50 Ng, Ur ~~LOC~~: POSITIVE — AB
Cocaine Metabolite,Ur ~~LOC~~: NOT DETECTED
MDMA (Ecstasy)Ur Screen: NOT DETECTED
Methadone Scn, Ur: NOT DETECTED
Opiate, Ur Screen: NOT DETECTED
Phencyclidine (PCP) Ur S: NOT DETECTED
Tricyclic, Ur Screen: NOT DETECTED

## 2022-08-24 LAB — COMPREHENSIVE METABOLIC PANEL
ALT: 31 U/L (ref 0–44)
AST: 29 U/L (ref 15–41)
Albumin: 4.2 g/dL (ref 3.5–5.0)
Alkaline Phosphatase: 141 U/L — ABNORMAL HIGH (ref 38–126)
Anion gap: 9 (ref 5–15)
BUN: 12 mg/dL (ref 6–20)
CO2: 20 mmol/L — ABNORMAL LOW (ref 22–32)
Calcium: 9.1 mg/dL (ref 8.9–10.3)
Chloride: 108 mmol/L (ref 98–111)
Creatinine, Ser: 0.95 mg/dL (ref 0.61–1.24)
GFR, Estimated: >60 mL/min (ref >60)
Glucose, Bld: 144 mg/dL — ABNORMAL HIGH (ref 70–99)
Potassium: 3.4 mmol/L — ABNORMAL LOW (ref 3.5–5.1)
Sodium: 137 mmol/L (ref 135–145)
Total Bilirubin: 0.5 mg/dL (ref 0.3–1.2)
Total Protein: 8.6 g/dL — ABNORMAL HIGH (ref 6.5–8.1)

## 2022-08-24 LAB — CBC
HCT: 45.8 % (ref 39.0–52.0)
Hemoglobin: 14.8 g/dL (ref 13.0–17.0)
MCH: 29.7 pg (ref 26.0–34.0)
MCHC: 32.3 g/dL (ref 30.0–36.0)
MCV: 91.8 fL (ref 80.0–100.0)
Platelets: 239 10*3/uL (ref 150–400)
RBC: 4.99 MIL/uL (ref 4.22–5.81)
RDW: 15.4 % (ref 11.5–15.5)
WBC: 7.2 10*3/uL (ref 4.0–10.5)
nRBC: 0 % (ref 0.0–0.2)

## 2022-08-24 LAB — SALICYLATE LEVEL: Salicylate Lvl: <7 mg/dL — ABNORMAL LOW (ref 7.0–30.0)

## 2022-08-24 LAB — ETHANOL: Alcohol, Ethyl (B): <10 mg/dL (ref <10)

## 2022-08-24 LAB — ACETAMINOPHEN LEVEL: Acetaminophen (Tylenol), Serum: 11 ug/mL (ref 10–30)

## 2022-08-24 NOTE — ED Notes (Signed)
Psych completed assessment
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
## 2022-08-24 NOTE — ED Notes (Signed)
Hospital meal provided.  100% consumed, pt tolerated w/o complaints.  Waste discarded appropriately.   
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
## 2022-08-24 NOTE — ED Notes (Signed)
Verified correct patient and correct discharge papers given. Pt alert and oriented X 4, stable for discharge. RR even and unlabored, color WNL. Discussed discharge instructions and follow-up as directed. Discharge medications discussed, when prescribed. Pt had opportunity to ask questions, and RN available to provide patient and/or family education. Left with mother. Returned all belongings. Does not wish for repeat VS.

## 2022-08-25 DIAGNOSIS — Z638 Other specified problems related to primary support group: Secondary | ICD-10-CM

## 2022-08-25 DIAGNOSIS — Z789 Other specified health status: Secondary | ICD-10-CM | POA: Insufficient documentation

## 2022-12-21 ENCOUNTER — Encounter: Payer: Self-pay | Admitting: Emergency Medicine

## 2022-12-21 ENCOUNTER — Emergency Department: Payer: Medicare HMO

## 2022-12-21 ENCOUNTER — Emergency Department
Admission: EM | Admit: 2022-12-21 | Discharge: 2022-12-21 | Disposition: A | Discharge status: Home / Self Care | Payer: Medicare HMO | Attending: Emergency Medicine | Admitting: Emergency Medicine

## 2022-12-21 ENCOUNTER — Other Ambulatory Visit: Payer: Self-pay

## 2022-12-21 DIAGNOSIS — E119 Type 2 diabetes mellitus without complications: Secondary | ICD-10-CM | POA: Diagnosis not present

## 2022-12-21 DIAGNOSIS — I160 Hypertensive urgency: Secondary | ICD-10-CM | POA: Insufficient documentation

## 2022-12-21 DIAGNOSIS — R519 Headache, unspecified: Secondary | ICD-10-CM | POA: Insufficient documentation

## 2022-12-21 DIAGNOSIS — I10 Essential (primary) hypertension: Secondary | ICD-10-CM | POA: Diagnosis not present

## 2022-12-21 DIAGNOSIS — F410 Panic disorder [episodic paroxysmal anxiety] without agoraphobia: Secondary | ICD-10-CM | POA: Diagnosis present

## 2022-12-21 LAB — CBC
HCT: 39.6 % (ref 39.0–52.0)
Hemoglobin: 12.5 g/dL — ABNORMAL LOW (ref 13.0–17.0)
MCH: 29.6 pg (ref 26.0–34.0)
MCHC: 31.6 g/dL (ref 30.0–36.0)
MCV: 93.6 fL (ref 80.0–100.0)
Platelets: 274 10*3/uL (ref 150–400)
RBC: 4.23 MIL/uL (ref 4.22–5.81)
RDW: 14.2 % (ref 11.5–15.5)
WBC: 5.5 10*3/uL (ref 4.0–10.5)
nRBC: 0 % (ref 0.0–0.2)

## 2022-12-21 LAB — BASIC METABOLIC PANEL
Anion gap: 10 (ref 5–15)
BUN: 14 mg/dL (ref 6–20)
CO2: 23 mmol/L (ref 22–32)
Calcium: 9.4 mg/dL (ref 8.9–10.3)
Chloride: 102 mmol/L (ref 98–111)
Creatinine, Ser: 1.01 mg/dL (ref 0.61–1.24)
GFR, Estimated: >60 mL/min (ref >60)
Glucose, Bld: 115 mg/dL — ABNORMAL HIGH (ref 70–99)
Potassium: 3.6 mmol/L (ref 3.5–5.1)
Sodium: 135 mmol/L (ref 135–145)

## 2022-12-21 LAB — SEDIMENTATION RATE: Sed Rate: 31 mm/h — ABNORMAL HIGH (ref 0–20)

## 2022-12-21 MED ORDER — LISINOPRIL 10 MG PO TABS
20.0000 mg | ORAL_TABLET | Freq: Once | ORAL | Status: AC
  Administered 2022-12-21: 20 mg via ORAL
  Filled 2022-12-21: qty 2

## 2022-12-21 MED ORDER — AMLODIPINE BESYLATE 5 MG PO TABS
5.0000 mg | ORAL_TABLET | Freq: Once | ORAL | Status: AC
  Administered 2022-12-21: 5 mg via ORAL
  Filled 2022-12-21: qty 1

## 2022-12-21 MED ORDER — MELOXICAM 7.5 MG PO TABS
7.5000 mg | ORAL_TABLET | ORAL | Status: AC
  Administered 2022-12-21: 7.5 mg via ORAL
  Filled 2022-12-21: qty 1

## 2022-12-21 MED ORDER — AMLODIPINE BESYLATE 5 MG PO TABS: 5.0000 mg | ORAL_TABLET | Freq: Every day | ORAL | 0 refills | Status: AC

## 2022-12-21 NOTE — ED Triage Notes (Addendum)
Pt via POV from home. States that he has been having panic attacks all night and was unable to sleep. Pt does take medication for it and took it this AM. Reports that he has a headache, which is normal for his panic attack per pt. States that this time "scared him" so he came in to be seen. States when he took his medication this morning he felt better. Pt also had has a hx of HTN, BP 188/108 and just took his BP medication prior to arrival. Pt is A&OX4 and NAD

## 2022-12-21 NOTE — ED Provider Notes (Signed)
[facility] Provider Note    Event Date/Time   First MD Initiated Contact with Patient 12/21/22 0831     (approximate)   History   Panic Attack   HPI  [name] is a 52 y.o. male with a history of hypertension, diabetes, adjustment disorder,  Patient reports that since last night he has had a throbbing frontal headache.  Reports it comes and goes.  It started last night he has been worried about it.  He does not know what the causes.  He reports he has had this kind of headache before though.  Does not go to either side there is no vision changes walking without difficulty no speech changes no numbness  Is not noticing weakness or other concerns.  Presently reports it is mild sort of throbbing frontal in nature.  He reports compliance with his medications, believes he took his lisinopril and metoprolol this morning.  Been no fevers or chills.  No numbness or weakness.  No neck pain except he has chronic neck and shoulder pain has been dealing with for months now.  Reports he typically takes meloxicam but is not taking it this morning   No chest pain.  No shortness of breath.  Physical Exam   Triage Vital Signs: ED Triage Vitals  Encounter Vitals Group     BP 12/21/22 0802 (!) 188/108     Systolic BP Percentile --      Diastolic BP Percentile --      Pulse Rate 12/21/22 0800 87     Resp 12/21/22 0800 19     Temp 12/21/22 0800 99 F (37.2 C)     Temp Source 12/21/22 0800 Oral     SpO2 12/21/22 0800 99 %     Weight 12/21/22 0801 175 lb (79.4 kg)     Height 12/21/22 0801 5\' 8"  (1.727 m)     Head Circumference --      Peak Flow --      Pain Score 12/21/22 0801 5     Pain Loc --      Pain Education --      Exclude from Growth Chart --     Most recent vital signs: Vitals:   12/21/22 0802 12/21/22 1055  BP: (!) 188/108 (!) 164/107  Pulse:    Resp:    Temp:    SpO2:       General: Awake, no distress.  Fully alert well-oriented.  Moves  all extremities well with normal strength.  No pronator drift in extremity.  Walks with normal gait.  Speech is clear extraocular movements are normal.  No photophobia.  No tenderness over the temporal arteries, normal bilateral temporal artery pulsations. CV:  Good peripheral perfusion.  Resp:  Normal effort.  Abd:  No distention.  Other:     ED Results / Procedures / Treatments   Labs (all labs ordered are listed, but only abnormal results are displayed) Labs Reviewed  CBC - Abnormal; Notable for the following components:      Result Value   Hemoglobin 12.5 (*)    All other components within normal limits  BASIC METABOLIC PANEL - Abnormal; Notable for the following components:   Glucose, Bld 115 (*)    All other components within normal limits  SEDIMENTATION RATE - Abnormal; Notable for the following components:   Sed Rate 31 (*)    All other components within normal limits   CBC and metabolic panel interpreted by me as normal.  EKG  930 heart rate 70 QRS 90He interpreted by me C4 40 Normal sinus rhythm,  left ventricular hypertrophy with repolarization abnormality.  No evidence of ischemia   RADIOLOGY  CT head interpreted by me as grossly negative for intracranial hemorrhage     PROCEDURES:  Critical Care performed: No  Procedures   MEDICATIONS ORDERED IN ED: Medications  amLODipine (NORVASC) tablet 5 mg (5 mg Oral Given 12/21/22 0849)  meloxicam (MOBIC) tablet 7.5 mg (7.5 mg Oral Given 12/21/22 0849)  lisinopril (ZESTRIL) tablet 20 mg (20 mg Oral Given 12/21/22 1054)     IMPRESSION / MDM / ASSESSMENT AND PLAN / ED COURSE  I reviewed the triage vital signs and the nursing notes.                              Differential diagnosis includes, but is not limited to, stress, tension headache, hypertensive urgency, no clinical signs or symptoms that would highly suggest acute ruptured aneurysm, tumor, mass lesion stroke.  No central neurologic findings.   Headache comes and goes it is moderate throbbing frontal.  No evidence of arteritis by examination.  He has neck discomfort but it advises that is quite chronic in nature has been ongoing for months.  He is awake alert well-oriented without distress.  Blood pressure is notably elevated, however is also able to review prior notes including previous ED visit [facility] and his blood pressure with severe diastolic hypertension appears to be present on prior evaluations as well.  He reports compliance with his medication, but did not bring it with him, and he is not 100% certain on it but thinks he is taking lisinopril and metoprolol that was last hospital note noted that he was to discontinue it.  Will add Norvasc, reassess blood pressure.  CT head performed negative for intracranial hemorrhage or acute finding.  No infectious symptoms.  No findings would be highly suggestive of venous sinus thrombosis, aneurysm, mass lesion etc.  Patient's presentation is most consistent with acute complicated illness / injury requiring diagnostic workup.      Clinical Course as of 12/21/22 1327  Wed Dec 21, 2022  1018 ESR less than 40, unlikely GCA [MQ]    Clinical Course User Index [MQ] Sharyn Creamer, MD   Vitals:   12/21/22 0802 12/21/22 1055  BP: (!) 188/108 (!) 164/107  Pulse:    Resp:    Temp:    SpO2:       Workup very reassuring.  Patient blood pressure improving.  Blood pressure is not, albeit, perfect, but at the same time it appears to be in keeping with previous evaluations.  I suspect at this time there is no evidence to support hypertensive emergency.  He is awake alert oriented.  I have added a prescription for amlodipine.  Some question as to whether or not he is truly been compliant or continue to take metoprolol and lisinopril.  He has PCP whom he can follow-up with.  Prescription for 1 month of Norvasc written.  At this time no evidence to support acute life-threatening causation of headache,  and blood pressure being further managed.  Return precautions and prescription provided to patient  FINAL CLINICAL IMPRESSION(S) / ED DIAGNOSES   Final diagnoses:  Hypertensive urgency     Rx / DC Orders   ED Discharge Orders          Ordered    amLODipine (NORVASC) 5 MG tablet  Daily        12/21/22 1149             Note:  This document was prepared using Dragon voice recognition software and may include unintentional dictation errors.   Sharyn Creamer, MD 12/21/22 1327

## 2023-08-24 ENCOUNTER — Emergency Department
Admission: EM | Admit: 2023-08-24 | Discharge: 2023-08-24 | Disposition: A | Discharge status: Home / Self Care | Attending: Emergency Medicine | Admitting: Emergency Medicine

## 2023-08-24 ENCOUNTER — Other Ambulatory Visit: Payer: Self-pay

## 2023-08-24 DIAGNOSIS — J449 Chronic obstructive pulmonary disease, unspecified: Secondary | ICD-10-CM | POA: Diagnosis not present

## 2023-08-24 DIAGNOSIS — Z794 Long term (current) use of insulin: Secondary | ICD-10-CM | POA: Insufficient documentation

## 2023-08-24 DIAGNOSIS — L080 Pyoderma: Secondary | ICD-10-CM | POA: Insufficient documentation

## 2023-08-24 DIAGNOSIS — E119 Type 2 diabetes mellitus without complications: Secondary | ICD-10-CM | POA: Diagnosis not present

## 2023-08-24 DIAGNOSIS — I10 Essential (primary) hypertension: Secondary | ICD-10-CM | POA: Diagnosis not present

## 2023-08-24 DIAGNOSIS — L989 Disorder of the skin and subcutaneous tissue, unspecified: Secondary | ICD-10-CM | POA: Diagnosis present

## 2023-08-24 LAB — CBC WITH DIFFERENTIAL/PLATELET
Abs Immature Granulocytes: 0.08 K/uL — ABNORMAL HIGH (ref 0.00–0.07)
Basophils Absolute: 0 K/uL (ref 0.0–0.1)
Basophils Relative: 0 %
Eosinophils Absolute: 0.1 K/uL (ref 0.0–0.5)
Eosinophils Relative: 1 %
HCT: 35.2 % — ABNORMAL LOW (ref 39.0–52.0)
Hemoglobin: 11.2 g/dL — ABNORMAL LOW (ref 13.0–17.0)
Immature Granulocytes: 1 %
Lymphocytes Relative: 18 %
Lymphs Abs: 1.2 K/uL (ref 0.7–4.0)
MCH: 28.1 pg (ref 26.0–34.0)
MCHC: 31.8 g/dL (ref 30.0–36.0)
MCV: 88.4 fL (ref 80.0–100.0)
Monocytes Absolute: 0.6 K/uL (ref 0.1–1.0)
Monocytes Relative: 8 %
Neutro Abs: 4.8 K/uL (ref 1.7–7.7)
Neutrophils Relative %: 72 %
Platelets: 314 K/uL (ref 150–400)
RBC: 3.98 MIL/uL — ABNORMAL LOW (ref 4.22–5.81)
RDW: 15.4 % (ref 11.5–15.5)
WBC: 6.7 K/uL (ref 4.0–10.5)
nRBC: 0 % (ref 0.0–0.2)

## 2023-08-24 LAB — URINALYSIS, W/ REFLEX TO CULTURE (INFECTION SUSPECTED)
Bacteria, UA: NONE SEEN
Bilirubin Urine: NEGATIVE
Glucose, UA: >=500 mg/dL — AB
Hgb urine dipstick: NEGATIVE
Ketones, ur: NEGATIVE mg/dL
Leukocytes,Ua: NEGATIVE
Nitrite: NEGATIVE
Protein, ur: NEGATIVE mg/dL
Specific Gravity, Urine: 1.023 (ref 1.005–1.030)
Squamous Epithelial / HPF: 0 /HPF (ref 0–5)
pH: 6 (ref 5.0–8.0)

## 2023-08-24 LAB — COMPREHENSIVE METABOLIC PANEL WITH GFR
ALT: 127 U/L — ABNORMAL HIGH (ref 0–44)
AST: 117 U/L — ABNORMAL HIGH (ref 15–41)
Albumin: 3.2 g/dL — ABNORMAL LOW (ref 3.5–5.0)
Alkaline Phosphatase: 113 U/L (ref 38–126)
Anion gap: 10 (ref 5–15)
BUN: 7 mg/dL (ref 6–20)
CO2: 21 mmol/L — ABNORMAL LOW (ref 22–32)
Calcium: 9.1 mg/dL (ref 8.9–10.3)
Chloride: 101 mmol/L (ref 98–111)
Creatinine, Ser: 0.83 mg/dL (ref 0.61–1.24)
GFR, Estimated: >60 mL/min (ref >60)
Glucose, Bld: 348 mg/dL — ABNORMAL HIGH (ref 70–99)
Potassium: 3.4 mmol/L — ABNORMAL LOW (ref 3.5–5.1)
Sodium: 132 mmol/L — ABNORMAL LOW (ref 135–145)
Total Bilirubin: 0.6 mg/dL (ref 0.0–1.2)
Total Protein: 8.3 g/dL — ABNORMAL HIGH (ref 6.5–8.1)

## 2023-08-24 LAB — LACTIC ACID, PLASMA
Lactic Acid, Venous: 2.1 mmol/L (ref 0.5–1.9)
Lactic Acid, Venous: 1.2 mmol/L (ref 0.5–1.9)

## 2023-08-24 MED ORDER — SODIUM CHLORIDE 0.9 % IV SOLN
2.0000 g | Freq: Once | INTRAVENOUS | Status: AC
  Administered 2023-08-24: 2 g via INTRAVENOUS
  Filled 2023-08-24: qty 20

## 2023-08-24 MED ORDER — SODIUM CHLORIDE 0.9 % IV BOLUS
1000.0000 mL | Freq: Once | INTRAVENOUS | Status: AC
  Administered 2023-08-24: 1000 mL via INTRAVENOUS

## 2023-08-24 MED ORDER — CEPHALEXIN 500 MG PO CAPS: 500.0000 mg | ORAL_CAPSULE | Freq: Three times a day (TID) | ORAL | 0 refills | Status: AC

## 2023-08-24 NOTE — ED Provider Triage Note (Signed)
 Emergency Medicine Provider Triage Evaluation Note  [name] , a 53 y.o. male  was evaluated in triage.  Pt complains of presents today with history of rash in arms, legs, face, thorax.  Patient denies history of working outdoors, pets at home, STDs.  Patient states lesions started with a blister with posterior peeling, lesions are itching.  Review of Systems  Positive:  Negative:  Physical Exam  BP (!) 131/98 (BP Location: Left Arm)   Pulse 84   Temp 98.5 F (36.9 C) (Oral)   Resp 20   Ht 5' 8 (1.727 m)   Wt 79.8 kg   SpO2 100%   BMI 26.76 kg/m  Gen:   Awake, no distress   Resp:  Normal effort  MSK:   Moves extremities without difficulty  Other:  Skin: Presence of circular lesions in the arms, lower legs, anterior thorax, lower third of the face.  Lesions has right bright  base without skin, no presence of purulent secretions.  Medical Decision Making  Medically screening exam initiated at 3:33 PM.  Appropriate orders placed.  Myers Tutterow was informed that the remainder of the evaluation will be completed by another provider, this initial triage assessment does not replace that evaluation, and the importance of remaining in the ED until their evaluation is complete. Presents today with a rash in thorax, face, upper and lower extremities.  Triage ordered CBC CMP and cultures    Janit Kast, PA-C 08/24/23 1535

## 2023-08-24 NOTE — ED Triage Notes (Addendum)
 Pt reports having skin lesions appear on his arms and legs over the last 2 days. Initially they were pruritic and then became painful. Some have serosanguinous drainage. Pt does report subjective fever. Denies N/V. Denies new medication.

## 2023-08-24 NOTE — ED Provider Notes (Signed)
 [facility] Provider Note    Event Date/Time   First MD Initiated Contact with Patient 08/24/23 1846     (approximate)   History   Chief Complaint: No chief complaint on file.   HPI  [name] is a 53 y.o. male with history of diabetes, COPD, hypertension who comes ED complaining of pruritic and painful sores in multiple locations over his arms and legs.  Denies fever chills chest pain shortness of breath.  Has had this before over the past several years intermittently.  Denies any tick bites spider bites or animal encounters        Past Medical History:  Diagnosis Date   Anxiety    Chronic back pain    Chronic neck pain    COPD (chronic obstructive pulmonary disease) (HCC)    Depression    Diabetes mellitus without complication (HCC)    Enlarged heart    pt report   Enlarged kidney Left   GERD (gastroesophageal reflux disease)    Headache, migraine    Hypertension    Sleep apnea     Current Outpatient Rx   Order #: [id number] Class: Historical Med   Order #: [id number] Class: Normal   Order #: [id number] Class: Historical Med   Order #: [id number] Class: Historical Med   Order #: [id number] Class: Historical Med   Order #: [id number] Class: Historical Med   Order #: [id number] Class: Historical Med   Order #: [id number] Class: Historical Med   Order #: [id number] Class: Historical Med   Order #: [id number] Class: Historical Med   Order #: [id number] Class: Historical Med   Order #: [id number] Class: Historical Med   Order #: [id number] Class: No Print   Order #: [id number] Class: Historical Med   Order #: [id number] Class: No Print   Order #: [id number] Class: Historical Med   Order #: [id number] Class: Historical Med   Order #: [id number] Class: Historical Med   Order #: [id number] Class: Historical Med   Order #: [id number] Class: Historical Med   Order #: [id number] Class: Historical Med   Order #: [id number] Class: Historical Med   Order #: [id number] Class:  Historical Med   Order #: [id number] Class: Historical Med   Order #: [id number] Class: Historical Med    Past Surgical History:  Procedure Laterality Date   CIRCUMCISION     COLONOSCOPY WITH PROPOFOL  N/A 11/01/2016   Procedure: COLONOSCOPY WITH PROPOFOL ;  Surgeon: Therisa Bi, MD;  Location: [facility] ENDOSCOPY;  Service: Gastroenterology;  Laterality: N/A;   COLONOSCOPY WITH PROPOFOL  N/A 09/14/2018   Procedure: COLONOSCOPY WITH PROPOFOL ;  Surgeon: Therisa Bi, MD;  Location: [facility] ENDOSCOPY;  Service: Gastroenterology;  Laterality: N/A;   COLONOSCOPY WITH PROPOFOL  N/A 12/17/2020   Procedure: COLONOSCOPY WITH PROPOFOL ;  Surgeon: Therisa Bi, MD;  Location: [facility] ENDOSCOPY;  Service: Gastroenterology;  Laterality: N/A;   ESOPHAGOGASTRODUODENOSCOPY N/A 12/17/2020   Procedure: ESOPHAGOGASTRODUODENOSCOPY (EGD);  Surgeon: Therisa Bi, MD;  Location: [facility] ENDOSCOPY;  Service: Gastroenterology;  Laterality: N/A;   ESOPHAGOGASTRODUODENOSCOPY (EGD) WITH PROPOFOL  N/A 03/30/2021   Procedure: ESOPHAGOGASTRODUODENOSCOPY (EGD) WITH PROPOFOL ;  Surgeon: Therisa Bi, MD;  Location: [facility] ENDOSCOPY;  Service: Gastroenterology;  Laterality: N/A;    Physical Exam   Triage Vital Signs: ED Triage Vitals  Encounter Vitals Group     BP 08/24/23 1527 (!) 131/98     Girls Systolic BP Percentile --      Girls Diastolic BP Percentile --      Boys Systolic BP Percentile --      Boys Diastolic BP Percentile --      Pulse Rate 08/24/23 1527 84  Resp 08/24/23 1527 20     Temp 08/24/23 1527 98.5 F (36.9 C)     Temp Source 08/24/23 1527 Oral     SpO2 08/24/23 1527 100 %     Weight 08/24/23 1531 176 lb (79.8 kg)     Height 08/24/23 1531 5' 8 (1.727 m)     Head Circumference --      Peak Flow --      Pain Score 08/24/23 1530 8     Pain Loc --      Pain Education --      Exclude from Growth Chart --     Most recent vital signs: Vitals:   08/24/23 1527  BP: (!) 131/98  Pulse: 84  Resp: 20  Temp: 98.5 F (36.9  C)  SpO2: 100%    General: Awake, no distress.  CV:  Good peripheral perfusion.  Regular rate rhythm Resp:  Normal effort.  Clear to auscultation Abd:  No distention.  Soft nontender Other:  Multiple lesions on arms legs and 1 on the back which are 1 to 2 cm in size, circular, superficial in the skin, appears ulcerations which are dried and crusted.  No surrounding erythema or inflammatory changes.  No crepitus, no exudate.  Patient also has innumerable similar lesions that are healed and hyperpigmented from prior episodes.   ED Results / Procedures / Treatments   Labs (all labs ordered are listed, but only abnormal results are displayed) Labs Reviewed  LACTIC ACID, PLASMA - Abnormal; Notable for the following components:      Result Value   Lactic Acid, Venous 2.1 (*)    All other components within normal limits  COMPREHENSIVE METABOLIC PANEL WITH GFR - Abnormal; Notable for the following components:   Sodium 132 (*)    Potassium 3.4 (*)    CO2 21 (*)    Glucose, Bld 348 (*)    Total Protein 8.3 (*)    Albumin 3.2 (*)    AST 117 (*)    ALT 127 (*)    All other components within normal limits  CBC WITH DIFFERENTIAL/PLATELET - Abnormal; Notable for the following components:   RBC 3.98 (*)    Hemoglobin 11.2 (*)    HCT 35.2 (*)    Abs Immature Granulocytes 0.08 (*)    All other components within normal limits  URINALYSIS, W/ REFLEX TO CULTURE (INFECTION SUSPECTED) - Abnormal; Notable for the following components:   Color, Urine YELLOW (*)    APPearance CLEAR (*)    Glucose, UA >=500 (*)    All other components within normal limits  LACTIC ACID, PLASMA     EKG    RADIOLOGY    PROCEDURES:  Procedures   MEDICATIONS ORDERED IN ED: Medications  cefTRIAXone  (ROCEPHIN ) 2 g in sodium chloride  0.9 % 100 mL IVPB (has no administration in time range)  sodium chloride  0.9 % bolus 1,000 mL (has no administration in time range)     IMPRESSION / MDM / ASSESSMENT AND  PLAN / ED COURSE  I reviewed the triage vital signs and the nursing notes.  DDx: AKI, uremia, UTI, ecthyma, pyoderma  Patient's presentation is most consistent with acute presentation with potential threat to life or bodily function.  Patient presents with numerous superficial skin lesions, painful, I think most likely inflammatory given the recurrent nature of them.  However with his diabetes, will treat with Keflex  for a possible superficial strep infection.  He has follow-up with his doctor in 4 days  which I encouraged him to keep.       FINAL CLINICAL IMPRESSION(S) / ED DIAGNOSES   Final diagnoses:  Ecthyma  Type 2 diabetes mellitus without complication, with long-term current use of insulin  (HCC)     Rx / DC Orders   ED Discharge Orders     None        Note:  This document was prepared using Dragon voice recognition software and may include unintentional dictation errors.   Viviann Pastor, MD 08/24/23 [phone]

## 2023-08-24 NOTE — ED Notes (Signed)
1st set of blood cultures sent 

## 2023-11-15 ENCOUNTER — Emergency Department

## 2023-11-15 ENCOUNTER — Other Ambulatory Visit: Payer: Self-pay

## 2023-11-15 ENCOUNTER — Emergency Department
Admission: EM | Admit: 2023-11-15 | Discharge: 2023-11-15 | Disposition: A | Discharge status: Home / Self Care | Attending: Emergency Medicine | Admitting: Emergency Medicine

## 2023-11-15 DIAGNOSIS — I10 Essential (primary) hypertension: Secondary | ICD-10-CM | POA: Insufficient documentation

## 2023-11-15 DIAGNOSIS — E119 Type 2 diabetes mellitus without complications: Secondary | ICD-10-CM | POA: Diagnosis not present

## 2023-11-15 DIAGNOSIS — W108XXA Fall (on) (from) other stairs and steps, initial encounter: Secondary | ICD-10-CM | POA: Insufficient documentation

## 2023-11-15 DIAGNOSIS — M25562 Pain in left knee: Secondary | ICD-10-CM | POA: Insufficient documentation

## 2023-11-15 DIAGNOSIS — W19XXXA Unspecified fall, initial encounter: Secondary | ICD-10-CM

## 2023-11-15 DIAGNOSIS — J449 Chronic obstructive pulmonary disease, unspecified: Secondary | ICD-10-CM | POA: Insufficient documentation

## 2023-11-15 DIAGNOSIS — M25532 Pain in left wrist: Secondary | ICD-10-CM | POA: Insufficient documentation

## 2023-11-15 MED ORDER — KETOROLAC TROMETHAMINE 30 MG/ML IJ SOLN
30.0000 mg | Freq: Once | INTRAMUSCULAR | Status: AC
  Administered 2023-11-15: 30 mg via INTRAMUSCULAR
  Filled 2023-11-15: qty 1

## 2023-11-15 NOTE — Discharge Instructions (Addendum)
 Your evaluated in the ED following a fall.  Your left wrist and left knee x-ray is normal.  There is no fracture or broken bone.  Please remain in wrist splint for 1 week.  Intermittently remove splint and apply ice to help reduce swelling.  Apply ice to the affected knee and limit physical activity.  Keep knee elevated above heart level is much as possible.  Follow-up with your primary care provider.  If any new or worsening symptoms occur feel free to return to ED for further evaluation.  Pain control:  Ibuprofen (motrin/aleve/advil) - You can take 3 tablets (600 mg) every 6 hours as needed for pain.  Acetaminophen  (tylenol ) - You can take 2 extra strength tablets (1000 mg) every 6 hours as needed for pain.  You can alternate these medications or take them together.  Make sure you eat food/drink water when taking these medications.

## 2023-11-15 NOTE — ED Triage Notes (Signed)
 Pt to ED for slip and fall down 8 steps yesterday. Denies hitting head or LOC. C/o left knee pain, left wrist pain.

## 2023-11-15 NOTE — ED Provider Notes (Signed)
 [facility] Emergency Department Provider Note     Event Date/Time   First MD Initiated Contact with Patient 11/15/23 1611     (approximate)   History   Fall   HPI  [name] is a 53 y.o. male with a past medical history of hypertension, diabetes, and COPD presents to the ED for evaluation of left knee pain and left wrist pain following a fall yesterday.  Patient reports he slipped down his stairs at home, denies head injury or LOC.  He has tried Tylenol  with no improvement.     Physical Exam   Triage Vital Signs: ED Triage Vitals [11/15/23 1510]  Encounter Vitals Group     BP (!) 163/105     Girls Systolic BP Percentile      Girls Diastolic BP Percentile      Boys Systolic BP Percentile      Boys Diastolic BP Percentile      Pulse Rate 99     Resp 16     Temp 98.3 F (36.8 C)     Temp src      SpO2 99 %     Weight 175 lb (79.4 kg)     Height 5' 8 (1.727 m)     Head Circumference      Peak Flow      Pain Score 8     Pain Loc      Pain Education      Exclude from Growth Chart     Most recent vital signs: Vitals:   11/15/23 1510 11/15/23 1700  BP: (!) 163/105 (!) 156/102  Pulse: 99 86  Resp: 16 16  Temp: 98.3 F (36.8 C)   SpO2: 99% 98%    General: Alert and oriented. INAD.   Head:  NCAT.  Eyes:  PERRLA. EOMI.  Neck:   No cervical spine tenderness to palpation. Full ROM without difficulty.  CV:  Good peripheral perfusion. RRR.  RESP:  Normal effort. LCTAB.  MSK:   Full ROM in all joints.  NEURO: Cranial nerves intact. No focal deficits. Speech is clear. Sensation and motor function intact. Normal muscle strength of UE & LE. Gait is steady.  OTHER:  Left knee reveals no visible deformity.  Small contusion to lateral aspect of patella.  Full range of motion without difficulty.  Neurovascular status intact all throughout.  Left wrist reveals small contusion to posterior aspect of distal forearm.  Mild tenderness to  palpation over ulnar styloid process.  Limited wrist flexion secondary to pain.  Neurovascular status intact throughout.  Radial pulses palpated 2+.  ED Results / Procedures / Treatments   Labs (all labs ordered are listed, but only abnormal results are displayed) Labs Reviewed - No data to display  RADIOLOGY  I personally viewed and evaluated these images as part of my medical decision making, as well as reviewing the written report by the radiologist.  ED Provider Interpretation: Normal-appearing left wrist and left knee x-ray.  DG Wrist Complete Left Result Date: 11/15/2023 CLINICAL DATA:  Injury.  Fell down steps.  Left knee and wrist pain. EXAM: LEFT WRIST - COMPLETE 3+ VIEW COMPARISON:  None Available. FINDINGS: There is no evidence of fracture or dislocation. There is no evidence of arthropathy or other focal bone abnormality. Soft tissues are unremarkable. IMPRESSION: Negative. Electronically Signed   By: Juliene Balder M.D.   On: 11/15/2023 16:14   DG Knee Complete 4 Views Left Result Date: 11/15/2023 CLINICAL DATA:  Injury.  Fall. EXAM: LEFT KNEE - COMPLETE 4+ VIEW COMPARISON:  None Available. FINDINGS: Left knee is located without a fracture or large joint effusion. Normal alignment. Mild spurring and degenerative changes at the patellofemoral compartment of the knee. IMPRESSION: No acute bone abnormality to the left knee. Electronically Signed   By: Juliene Balder M.D.   On: 11/15/2023 16:11    PROCEDURES:  Critical Care performed: No  Procedures   MEDICATIONS ORDERED IN ED: Medications  ketorolac  (TORADOL ) 30 MG/ML injection 30 mg (30 mg Intramuscular Given 11/15/23 1730)     IMPRESSION / MDM / ASSESSMENT AND PLAN / ED COURSE  I reviewed the triage vital signs and the nursing notes.                               53 y.o. male presents to the emergency department for evaluation and treatment of fall. See HPI for further details.   Differential diagnosis includes, but is not  limited to fracture, dislocation, contusion  Patient's presentation is most consistent with acute complicated illness / injury requiring diagnostic workup.  Patient is alert and oriented.  He is hemodynamically stable.  Vital signs are normal with the exception of an elevated BP of 156/102.  This appears to be patient's baseline.  Physical exam findings are as stated above no obvious deformity or absence of range of motion in areas of concern.  X-ray of left wrist and left knee are reassuring of any fracture or dislocation.  Patient treated with Toradol  for pain.  Will place patient in wrist splint and RICE therapy education provided.  He is in stable condition for discharge home.  ED return precaution discussed.  Advised to follow-up with his primary care provider as needed.  FINAL CLINICAL IMPRESSION(S) / ED DIAGNOSES   Final diagnoses:  Fall, initial encounter  Acute pain of left wrist  Acute pain of left knee   Rx / DC Orders   ED Discharge Orders     None        Note:  This document was prepared using Dragon voice recognition software and may include unintentional dictation errors.    Margrette, Tajah Noguchi A, PA-C 11/15/23 1815    Bradler, Evan K, MD 11/15/23 [phone]

## 2024-03-08 NOTE — Progress Notes (Signed)
 [facility] Refill Coordination Note  Specialty Medication(s) to be Shipped:  Inflammatory Disorders: Dupixent  Other medication(s) to be shipped: No additional medications requested for fill at this time  Specialty Medications not needed at this time: N/A   [name], DOB: [birth year] Phone: There are no phone numbers on file.   All above HIPAA information was verified with patient.   Was a nurse, learning disability used for this call? No  Completed refill call assessment today to schedule patient's medication shipment from the [facility]  [phone]).  All relevant notes have been reviewed.   Specialty medication(s) and dose(s) confirmed: Regimen is correct and unchanged.  Changes to medications: [name] reports no changes at this time. Changes to insurance: No New side effects reported not previously addressed with a pharmacist or physician: None reported Questions for the pharmacist: No  Confirmed patient received a Conservation Officer, Historic Buildings and a Surveyor, Mining with first shipment. The patient will receive a drug information handout for each medication shipped and additional FDA Medication Guides as required.     DISEASE/MEDICATION-SPECIFIC INFORMATION      N/A  SPECIALTY MEDICATION ADHERENCE   Medication Adherence   Patient reported X missed doses in the last month: 0 Specialty Medication: Dupixent 300 mg/2 ml Patient is on additional specialty medications: No Informant: patient        Were doses missed due to medication being on hold? No  Dupixent 300/2 mg/ml: 0 doses of medicine on hand   Specialty medication is an injection or given on a cycle: No  REFERRAL TO PHARMACIST   Referral to the pharmacist: Not needed   [facility]   Shipping address confirmed in Epic.   Cost and Payment: Patient has a copay of $~13. They are aware and have authorized the pharmacy to charge the credit card on file.  Delivery  Scheduled: Yes, Expected medication delivery date: 03/14/24.   Medication will be delivered via UPS to the prescription address in Epic WAM.  [name], PharmD  [facility]  Specialty Pharmacist

## 2024-03-12 ENCOUNTER — Emergency Department
Admission: EM | Admit: 2024-03-12 | Discharge: 2024-03-12 | Disposition: A | Discharge status: Home / Self Care | Attending: Emergency Medicine | Admitting: Emergency Medicine

## 2024-03-12 ENCOUNTER — Emergency Department

## 2024-03-12 ENCOUNTER — Other Ambulatory Visit: Payer: Self-pay

## 2024-03-12 DIAGNOSIS — W228XXA Striking against or struck by other objects, initial encounter: Secondary | ICD-10-CM | POA: Insufficient documentation

## 2024-03-12 DIAGNOSIS — J449 Chronic obstructive pulmonary disease, unspecified: Secondary | ICD-10-CM | POA: Insufficient documentation

## 2024-03-12 DIAGNOSIS — R0789 Other chest pain: Secondary | ICD-10-CM | POA: Insufficient documentation

## 2024-03-12 DIAGNOSIS — I10 Essential (primary) hypertension: Secondary | ICD-10-CM | POA: Insufficient documentation

## 2024-03-12 DIAGNOSIS — R0781 Pleurodynia: Secondary | ICD-10-CM | POA: Insufficient documentation

## 2024-03-12 DIAGNOSIS — E119 Type 2 diabetes mellitus without complications: Secondary | ICD-10-CM | POA: Insufficient documentation

## 2024-03-12 MED ORDER — LIDOCAINE 5 % EX PTCH
1.0000 | MEDICATED_PATCH | CUTANEOUS | Status: DC
  Administered 2024-03-12: 1 via TRANSDERMAL
  Filled 2024-03-12: qty 1

## 2024-03-12 MED ORDER — CYCLOBENZAPRINE HCL 10 MG PO TABS: 10.0000 mg | ORAL_TABLET | Freq: Three times a day (TID) | ORAL | 0 refills | Status: AC | PRN

## 2024-03-12 MED ORDER — LIDOCAINE 5 % EX PTCH: 1.0000 | MEDICATED_PATCH | CUTANEOUS | 0 refills | Status: AC

## 2024-03-12 MED ORDER — IBUPROFEN 600 MG PO TABS
600.0000 mg | ORAL_TABLET | Freq: Once | ORAL | Status: AC
  Administered 2024-03-12: 600 mg via ORAL
  Filled 2024-03-12: qty 1

## 2024-03-12 MED ORDER — CYCLOBENZAPRINE HCL 10 MG PO TABS
10.0000 mg | ORAL_TABLET | Freq: Once | ORAL | Status: AC
  Administered 2024-03-12: 10 mg via ORAL
  Filled 2024-03-12: qty 1

## 2024-03-12 MED ORDER — MELOXICAM 15 MG PO TABS: 15.0000 mg | ORAL_TABLET | Freq: Every day | ORAL | 0 refills | Status: AC

## 2024-03-12 NOTE — ED Notes (Signed)
 Patient transported to X-ray

## 2024-03-12 NOTE — ED Notes (Signed)
 Pt back from XR
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
